# Patient Record
Sex: Female | Born: 1975 | Race: White | Hispanic: No | Marital: Married | Smoking: Never smoker
Health system: Southern US, Community
[De-identification: ages and names within clinical notes are randomized; demographics above are authoritative.]

## PROBLEM LIST (undated history)

## (undated) ENCOUNTER — Inpatient Hospital Stay (HOSPITAL_COMMUNITY): Payer: Self-pay

## (undated) DIAGNOSIS — O99119 Other diseases of the blood and blood-forming organs and certain disorders involving the immune mechanism complicating pregnancy, unspecified trimester: Secondary | ICD-10-CM

## (undated) DIAGNOSIS — Z8619 Personal history of other infectious and parasitic diseases: Secondary | ICD-10-CM

## (undated) DIAGNOSIS — G43909 Migraine, unspecified, not intractable, without status migrainosus: Secondary | ICD-10-CM

## (undated) DIAGNOSIS — D696 Thrombocytopenia, unspecified: Secondary | ICD-10-CM

## (undated) HISTORY — DX: Personal history of other infectious and parasitic diseases: Z86.19

## (undated) HISTORY — DX: Migraine, unspecified, not intractable, without status migrainosus: G43.909

## (undated) HISTORY — DX: Thrombocytopenia, unspecified: D69.6

## (undated) HISTORY — DX: Thrombocytopenia, unspecified: O99.119

## (undated) HISTORY — PX: OTHER SURGICAL HISTORY: SHX169

---
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
## 2006-11-22 ENCOUNTER — Inpatient Hospital Stay (HOSPITAL_COMMUNITY): Admission: AD | Admit: 2006-11-22 | Discharge: 2006-11-22 | Payer: Self-pay | Admitting: Obstetrics & Gynecology

## 2007-02-01 ENCOUNTER — Inpatient Hospital Stay (HOSPITAL_COMMUNITY): Admission: AD | Admit: 2007-02-01 | Discharge: 2007-02-04 | Payer: Self-pay | Admitting: Obstetrics and Gynecology

## 2007-02-02 ENCOUNTER — Encounter (INDEPENDENT_AMBULATORY_CARE_PROVIDER_SITE_OTHER): Payer: Self-pay | Admitting: Specialist

## 2007-02-06 ENCOUNTER — Encounter: Admission: RE | Admit: 2007-02-06 | Discharge: 2007-03-08 | Payer: Self-pay | Admitting: Obstetrics and Gynecology

## 2007-03-09 ENCOUNTER — Encounter: Admission: RE | Admit: 2007-03-09 | Discharge: 2007-04-07 | Payer: Self-pay | Admitting: Obstetrics and Gynecology

## 2007-04-08 ENCOUNTER — Encounter: Admission: RE | Admit: 2007-04-08 | Discharge: 2007-05-08 | Payer: Self-pay | Admitting: Obstetrics and Gynecology

## 2007-05-09 ENCOUNTER — Encounter: Admission: RE | Admit: 2007-05-09 | Discharge: 2007-06-07 | Payer: Self-pay | Admitting: Obstetrics and Gynecology

## 2007-06-08 ENCOUNTER — Encounter: Admission: RE | Admit: 2007-06-08 | Discharge: 2007-07-08 | Payer: Self-pay | Admitting: Obstetrics and Gynecology

## 2007-07-09 ENCOUNTER — Encounter: Admission: RE | Admit: 2007-07-09 | Discharge: 2007-07-27 | Payer: Self-pay | Admitting: Obstetrics and Gynecology

## 2009-07-05 ENCOUNTER — Inpatient Hospital Stay (HOSPITAL_COMMUNITY): Admission: AD | Admit: 2009-07-05 | Discharge: 2009-07-07 | Payer: Self-pay | Admitting: Obstetrics and Gynecology

## 2011-02-28 LAB — CBC
HCT: 30.9 % — ABNORMAL LOW (ref 36.0–46.0)
HCT: 37.4 % (ref 36.0–46.0)
MCHC: 35.2 g/dL (ref 30.0–36.0)
MCV: 102.1 fL — ABNORMAL HIGH (ref 78.0–100.0)
MCV: 103.1 fL — ABNORMAL HIGH (ref 78.0–100.0)
Platelets: 112 10*3/uL — ABNORMAL LOW (ref 150–400)
Platelets: 139 10*3/uL — ABNORMAL LOW (ref 150–400)
RBC: 2.79 MIL/uL — ABNORMAL LOW (ref 3.87–5.11)
RDW: 13.5 % (ref 11.5–15.5)
WBC: 10.3 10*3/uL (ref 4.0–10.5)
WBC: 8.3 10*3/uL (ref 4.0–10.5)

## 2011-02-28 LAB — RH IMMUNE GLOB WKUP(>/=20WKS)(NOT WOMEN'S HOSP)

## 2011-05-26 LAB — OB RESULTS CONSOLE GBS: GBS: NEGATIVE

## 2011-11-10 LAB — OB RESULTS CONSOLE GC/CHLAMYDIA
Chlamydia: NEGATIVE
Gonorrhea: NEGATIVE

## 2011-11-10 LAB — OB RESULTS CONSOLE RUBELLA ANTIBODY, IGM: Rubella: IMMUNE

## 2011-11-10 LAB — OB RESULTS CONSOLE RPR: RPR: NONREACTIVE

## 2012-05-10 ENCOUNTER — Encounter: Payer: Self-pay | Admitting: Vascular Surgery

## 2012-05-25 LAB — OB RESULTS CONSOLE GBS: GBS: NEGATIVE

## 2012-06-14 ENCOUNTER — Telehealth (HOSPITAL_COMMUNITY): Payer: Self-pay | Admitting: *Deleted

## 2012-06-14 ENCOUNTER — Encounter (HOSPITAL_COMMUNITY): Payer: Self-pay | Admitting: *Deleted

## 2012-06-14 NOTE — Telephone Encounter (Signed)
Preadmission screen  

## 2012-06-15 ENCOUNTER — Inpatient Hospital Stay (HOSPITAL_COMMUNITY): Payer: BC Managed Care – PPO | Admitting: Anesthesiology

## 2012-06-15 ENCOUNTER — Inpatient Hospital Stay (HOSPITAL_COMMUNITY)
Admission: RE | Admit: 2012-06-15 | Discharge: 2012-06-17 | DRG: 373 | Disposition: A | Discharge status: Home / Self Care | Payer: BC Managed Care – PPO | Source: Ambulatory Visit | Attending: Obstetrics and Gynecology | Admitting: Obstetrics and Gynecology

## 2012-06-15 ENCOUNTER — Encounter (HOSPITAL_COMMUNITY): Payer: Self-pay

## 2012-06-15 ENCOUNTER — Encounter (HOSPITAL_COMMUNITY): Payer: Self-pay | Admitting: Anesthesiology

## 2012-06-15 DIAGNOSIS — D696 Thrombocytopenia, unspecified: Secondary | ICD-10-CM | POA: Diagnosis present

## 2012-06-15 DIAGNOSIS — D689 Coagulation defect, unspecified: Principal | ICD-10-CM | POA: Diagnosis present

## 2012-06-15 DIAGNOSIS — O9912 Other diseases of the blood and blood-forming organs and certain disorders involving the immune mechanism complicating childbirth: Principal | ICD-10-CM | POA: Diagnosis present

## 2012-06-15 LAB — CBC
HCT: 34 % — ABNORMAL LOW (ref 36.0–46.0)
Hemoglobin: 11.5 g/dL — ABNORMAL LOW (ref 12.0–15.0)
MCH: 34.3 pg — ABNORMAL HIGH (ref 26.0–34.0)
MCH: 34.1 pg — ABNORMAL HIGH (ref 26.0–34.0)
MCHC: 33.8 g/dL (ref 30.0–36.0)
Platelets: 124 10*3/uL — ABNORMAL LOW (ref 150–400)
RBC: 3.47 MIL/uL — ABNORMAL LOW (ref 3.87–5.11)
RBC: 3.37 MIL/uL — ABNORMAL LOW (ref 3.87–5.11)
WBC: 6.8 10*3/uL (ref 4.0–10.5)

## 2012-06-15 LAB — RPR: RPR Ser Ql: NONREACTIVE

## 2012-06-15 MED ORDER — LACTATED RINGERS IV SOLN
INTRAVENOUS | Status: DC
  Administered 2012-06-15 (×2): 1000 mL via INTRAVENOUS

## 2012-06-15 MED ORDER — PRENATAL MULTIVITAMIN CH
1.0000 | ORAL_TABLET | Freq: Every day | ORAL | Status: DC
  Administered 2012-06-16 – 2012-06-17 (×2): 1 via ORAL
  Filled 2012-06-15 (×2): qty 1

## 2012-06-15 MED ORDER — LACTATED RINGERS IV SOLN
500.0000 mL | INTRAVENOUS | Status: DC | PRN
  Administered 2012-06-15: 1000 mL via INTRAVENOUS

## 2012-06-15 MED ORDER — OXYTOCIN BOLUS FROM INFUSION
250.0000 mL | Freq: Once | INTRAVENOUS | Status: DC
  Filled 2012-06-15: qty 500

## 2012-06-15 MED ORDER — LIDOCAINE HCL (PF) 1 % IJ SOLN
INTRAMUSCULAR | Status: DC | PRN
  Administered 2012-06-15 (×3): 4 mL

## 2012-06-15 MED ORDER — CITRIC ACID-SODIUM CITRATE 334-500 MG/5ML PO SOLN: 30.0000 mL | ORAL | Status: DC | PRN

## 2012-06-15 MED ORDER — EPHEDRINE 5 MG/ML INJ: 10.0000 mg | INTRAVENOUS | Status: DC | PRN

## 2012-06-15 MED ORDER — METHYLERGONOVINE MALEATE 0.2 MG/ML IJ SOLN: 0.2000 mg | INTRAMUSCULAR | Status: DC | PRN

## 2012-06-15 MED ORDER — PHENYLEPHRINE 40 MCG/ML (10ML) SYRINGE FOR IV PUSH (FOR BLOOD PRESSURE SUPPORT): 80.0000 ug | PREFILLED_SYRINGE | INTRAVENOUS | Status: DC | PRN

## 2012-06-15 MED ORDER — ONDANSETRON HCL 4 MG/2ML IJ SOLN: 4.0000 mg | Freq: Four times a day (QID) | INTRAMUSCULAR | Status: DC | PRN

## 2012-06-15 MED ORDER — DIPHENHYDRAMINE HCL 25 MG PO CAPS: 25.0000 mg | ORAL_CAPSULE | Freq: Four times a day (QID) | ORAL | Status: DC | PRN

## 2012-06-15 MED ORDER — LIDOCAINE HCL (PF) 1 % IJ SOLN: 30.0000 mL | INTRAMUSCULAR | Status: DC | PRN

## 2012-06-15 MED ORDER — SENNOSIDES-DOCUSATE SODIUM 8.6-50 MG PO TABS
2.0000 | ORAL_TABLET | Freq: Every day | ORAL | Status: DC
  Administered 2012-06-15 – 2012-06-16 (×2): 2 via ORAL

## 2012-06-15 MED ORDER — SIMETHICONE 80 MG PO CHEW: 80.0000 mg | CHEWABLE_TABLET | ORAL | Status: DC | PRN

## 2012-06-15 MED ORDER — METHYLERGONOVINE MALEATE 0.2 MG PO TABS: 0.2000 mg | ORAL_TABLET | ORAL | Status: DC | PRN

## 2012-06-15 MED ORDER — MEASLES, MUMPS & RUBELLA VAC ~~LOC~~ INJ: 0.5000 mL | INJECTION | Freq: Once | SUBCUTANEOUS | Status: DC

## 2012-06-15 MED ORDER — BENZOCAINE-MENTHOL 20-0.5 % EX AERO
1.0000 "application " | INHALATION_SPRAY | CUTANEOUS | Status: DC | PRN
  Administered 2012-06-15: 1 via TOPICAL
  Filled 2012-06-15: qty 56

## 2012-06-15 MED ORDER — LACTATED RINGERS IV SOLN: 500.0000 mL | Freq: Once | INTRAVENOUS | Status: DC

## 2012-06-15 MED ORDER — OXYCODONE-ACETAMINOPHEN 5-325 MG PO TABS
1.0000 | ORAL_TABLET | ORAL | Status: DC | PRN
  Administered 2012-06-15 – 2012-06-17 (×7): 1 via ORAL
  Filled 2012-06-15 (×7): qty 1

## 2012-06-15 MED ORDER — EPHEDRINE 5 MG/ML INJ
10.0000 mg | INTRAVENOUS | Status: DC | PRN
  Filled 2012-06-15: qty 4

## 2012-06-15 MED ORDER — TERBUTALINE SULFATE 1 MG/ML IJ SOLN: 0.2500 mg | Freq: Once | INTRAMUSCULAR | Status: DC | PRN

## 2012-06-15 MED ORDER — OXYTOCIN 40 UNITS IN LACTATED RINGERS INFUSION - SIMPLE MED
1.0000 m[IU]/min | INTRAVENOUS | Status: DC
Start: 2012-06-15 — End: 2012-06-15
  Administered 2012-06-15 (×2): 2 m[IU]/min via INTRAVENOUS
  Filled 2012-06-15: qty 1000

## 2012-06-15 MED ORDER — TETANUS-DIPHTH-ACELL PERTUSSIS 5-2.5-18.5 LF-MCG/0.5 IM SUSP: 0.5000 mL | Freq: Once | INTRAMUSCULAR | Status: DC

## 2012-06-15 MED ORDER — FENTANYL 2.5 MCG/ML BUPIVACAINE 1/10 % EPIDURAL INFUSION (WH - ANES)
14.0000 mL/h | INTRAMUSCULAR | Status: DC
  Administered 2012-06-15: 14 mL/h via EPIDURAL
  Filled 2012-06-15 (×3): qty 60

## 2012-06-15 MED ORDER — IBUPROFEN 600 MG PO TABS: 600.0000 mg | ORAL_TABLET | Freq: Four times a day (QID) | ORAL | Status: DC | PRN

## 2012-06-15 MED ORDER — FLEET ENEMA 7-19 GM/118ML RE ENEM: 1.0000 | ENEMA | RECTAL | Status: DC | PRN

## 2012-06-15 MED ORDER — MEDROXYPROGESTERONE ACETATE 150 MG/ML IM SUSP: 150.0000 mg | INTRAMUSCULAR | Status: DC | PRN

## 2012-06-15 MED ORDER — WITCH HAZEL-GLYCERIN EX PADS: 1.0000 "application " | MEDICATED_PAD | CUTANEOUS | Status: DC | PRN

## 2012-06-15 MED ORDER — ACETAMINOPHEN 325 MG PO TABS: 650.0000 mg | ORAL_TABLET | ORAL | Status: DC | PRN

## 2012-06-15 MED ORDER — OXYTOCIN 40 UNITS IN LACTATED RINGERS INFUSION - SIMPLE MED
62.5000 mL/h | Freq: Once | INTRAVENOUS | Status: AC
  Administered 2012-06-15: 999 mL/h via INTRAVENOUS

## 2012-06-15 MED ORDER — OXYCODONE-ACETAMINOPHEN 5-325 MG PO TABS: 1.0000 | ORAL_TABLET | ORAL | Status: DC | PRN

## 2012-06-15 MED ORDER — IBUPROFEN 600 MG PO TABS
600.0000 mg | ORAL_TABLET | Freq: Four times a day (QID) | ORAL | Status: DC
  Administered 2012-06-15 – 2012-06-17 (×7): 600 mg via ORAL
  Filled 2012-06-15 (×7): qty 1

## 2012-06-15 MED ORDER — LANOLIN HYDROUS EX OINT: TOPICAL_OINTMENT | CUTANEOUS | Status: DC | PRN

## 2012-06-15 MED ORDER — ONDANSETRON HCL 4 MG/2ML IJ SOLN: 4.0000 mg | INTRAMUSCULAR | Status: DC | PRN

## 2012-06-15 MED ORDER — ONDANSETRON HCL 4 MG PO TABS: 4.0000 mg | ORAL_TABLET | ORAL | Status: DC | PRN

## 2012-06-15 MED ORDER — FENTANYL 2.5 MCG/ML BUPIVACAINE 1/10 % EPIDURAL INFUSION (WH - ANES)
INTRAMUSCULAR | Status: DC | PRN
  Administered 2012-06-15: 14 mL/h via EPIDURAL

## 2012-06-15 MED ORDER — DIPHENHYDRAMINE HCL 50 MG/ML IJ SOLN: 12.5000 mg | INTRAMUSCULAR | Status: DC | PRN

## 2012-06-15 MED ORDER — DIBUCAINE 1 % RE OINT
1.0000 "application " | TOPICAL_OINTMENT | RECTAL | Status: DC | PRN
  Administered 2012-06-15: 1 via RECTAL
  Filled 2012-06-15: qty 28

## 2012-06-15 MED ORDER — PHENYLEPHRINE 40 MCG/ML (10ML) SYRINGE FOR IV PUSH (FOR BLOOD PRESSURE SUPPORT)
80.0000 ug | PREFILLED_SYRINGE | INTRAVENOUS | Status: DC | PRN
  Filled 2012-06-15: qty 5

## 2012-06-15 NOTE — Anesthesia Procedure Notes (Signed)

## 2012-06-15 NOTE — Progress Notes (Signed)
SVD of vigerous female infant w/ apgars of  9,9.  Placenta delivered spontaneous w/ 3VC.   2nd degree lac repaired w/ 3-0 vicryl rapide, periurethral lac repaired w/ 3-0 vicryl rapide.  Fundus firm.  EBL 450cc .  Mom and baby doing well in LDR

## 2012-06-15 NOTE — H&P (Signed)
36 yo G3P2 @ 39wks presents for IO  Pt w/ thrombocytopenia and favorable cervix presents for IOL. Last plt count in office 120  Past history - see hollister, GBS neg SVD neg  AF, VSS Gen - NAD Abd - gravid, NT CV - RRR Lungs - clear bilaterally Cvx - 4cm in office  CBC pending  A/P:  IOL, thrombocytopenia, favorable cervix

## 2012-06-15 NOTE — Anesthesia Preprocedure Evaluation (Signed)

## 2012-06-15 NOTE — Progress Notes (Signed)
Pt comfortable w/ epidural  FHT reassuring Toco Q2 Cvx c/c/+2  A/P;  Will start pushing

## 2012-06-16 LAB — CBC
HCT: 31.2 % — ABNORMAL LOW (ref 36.0–46.0)
MCH: 34.2 pg — ABNORMAL HIGH (ref 26.0–34.0)
MCV: 101.6 fL — ABNORMAL HIGH (ref 78.0–100.0)
Platelets: 124 10*3/uL — ABNORMAL LOW (ref 150–400)
RDW: 14.3 % (ref 11.5–15.5)
WBC: 10.8 10*3/uL — ABNORMAL HIGH (ref 4.0–10.5)

## 2012-06-16 MED ORDER — HYDROCORTISONE ACE-PRAMOXINE 1-1 % RE FOAM
1.0000 | Freq: Two times a day (BID) | RECTAL | Status: DC
  Administered 2012-06-16 – 2012-06-17 (×2): 1 via RECTAL
  Filled 2012-06-16 (×2): qty 10

## 2012-06-16 MED ORDER — RHO D IMMUNE GLOBULIN 1500 UNIT/2ML IJ SOLN
300.0000 ug | Freq: Once | INTRAMUSCULAR | Status: AC
  Administered 2012-06-16: 300 ug via INTRAMUSCULAR
  Filled 2012-06-16: qty 2

## 2012-06-16 NOTE — Anesthesia Postprocedure Evaluation (Signed)
  Anesthesia Post-op Note  Patient: Haley Bernard  Procedure(s) Performed: * No procedures listed *  Patient Location: Mother/Baby  Anesthesia Type: Epidural  Level of Consciousness: awake  Airway and Oxygen Therapy: Patient Spontanous Breathing  Post-op Pain: none  Post-op Assessment: Patient's Cardiovascular Status Stable, Respiratory Function Stable, Patent Airway, No signs of Nausea or vomiting, Adequate PO intake, Pain level controlled, No headache, No backache, No residual numbness and No residual motor weakness  Post-op Vital Signs: Reviewed and stable  Complications: No apparent anesthesia complications

## 2012-06-16 NOTE — Progress Notes (Signed)
Post Partum Day 1 Subjective: no complaints, up ad lib, voiding and tolerating PO  Objective: Blood pressure 107/73, pulse 68, temperature 97.9 F (36.6 C), temperature source Oral, resp. rate 18, height 5\' 3"  (1.6 m), weight 62.596 kg (138 lb), last menstrual period 09/15/2011, SpO2 100.00%, unknown if currently breastfeeding.  Physical Exam:  General: alert and cooperative Lochia: appropriate Uterine Fundus: firm Incision: perineum intact, hemorrhoids DVT Evaluation: No evidence of DVT seen on physical exam.   Basename 06/16/12 0530 06/15/12 1408  HGB 10.5* 11.5*  HCT 31.2* 34.0*    Assessment/Plan: Plan for discharge tomorrow Proctofoam HC   LOS: 1 day   Vince Ainsley G 06/16/2012, 7:49 AM

## 2012-06-17 LAB — RH IG WORKUP (INCLUDES ABO/RH)
Antibody Screen: NEGATIVE
Fetal Screen: NEGATIVE
Unit division: 0

## 2012-06-17 MED ORDER — OXYCODONE-ACETAMINOPHEN 5-325 MG PO TABS: 1.0000 | ORAL_TABLET | ORAL | Status: AC | PRN

## 2012-06-17 MED ORDER — HYDROCORTISONE ACE-PRAMOXINE 1-1 % RE FOAM: 1.0000 | Freq: Two times a day (BID) | RECTAL | Status: AC

## 2012-06-17 MED ORDER — IBUPROFEN 600 MG PO TABS: 600.0000 mg | ORAL_TABLET | Freq: Four times a day (QID) | ORAL | Status: AC

## 2012-06-17 NOTE — Discharge Summary (Signed)
Obstetric Discharge Summary Reason for Admission: induction of labor Prenatal Procedures: ultrasound Intrapartum Procedures: spontaneous vaginal delivery Postpartum Procedures: none Complications-Operative and Postpartum: 2 degree perineal laceration Hemoglobin  Date Value Range Status  06/16/2012 10.5* 12.0 - 15.0 g/dL Final     HCT  Date Value Range Status  06/16/2012 31.2* 36.0 - 46.0 % Final    Physical Exam:  General: alert and cooperative Lochia: appropriate Uterine Fundus: firm Incision: perineum intact, hemorrhoids, non- thrombosed DVT Evaluation: No evidence of DVT seen on physical exam.  Discharge Diagnoses: Term Pregnancy-delivered  Discharge Information: Date: 06/17/2012 Activity: pelvic rest Diet: routine Medications: PNV, Ibuprofen, Percocet and proctofoam HC Condition: stable Instructions: refer to practice specific booklet Discharge to: home   Newborn Data: Live born female  Birth Weight: 7 lb 10 oz (3459 g) APGAR: 9, 9  Home with mother.  Baruch Lewers G 06/17/2012, 8:16 AM

## 2012-06-21 ENCOUNTER — Inpatient Hospital Stay (HOSPITAL_COMMUNITY): Admission: AD | Admit: 2012-06-21 | Payer: Self-pay | Source: Ambulatory Visit | Admitting: Obstetrics and Gynecology

## 2014-06-13 ENCOUNTER — Ambulatory Visit
Admission: RE | Admit: 2014-06-13 | Discharge: 2014-06-13 | Disposition: A | Discharge status: Home / Self Care | Payer: BC Managed Care – PPO | Source: Ambulatory Visit | Attending: Family Medicine | Admitting: Family Medicine

## 2014-06-13 ENCOUNTER — Other Ambulatory Visit: Payer: Self-pay | Admitting: Family Medicine

## 2014-06-13 DIAGNOSIS — R599 Enlarged lymph nodes, unspecified: Secondary | ICD-10-CM

## 2014-06-26 ENCOUNTER — Ambulatory Visit (INDEPENDENT_AMBULATORY_CARE_PROVIDER_SITE_OTHER): Payer: BC Managed Care – PPO | Admitting: General Surgery

## 2014-06-26 ENCOUNTER — Encounter (INDEPENDENT_AMBULATORY_CARE_PROVIDER_SITE_OTHER): Payer: Self-pay | Admitting: General Surgery

## 2014-06-26 VITALS — BP 116/70 | HR 71 | Temp 98.4°F | Ht 63.0 in | Wt 107.0 lb

## 2014-06-26 DIAGNOSIS — R590 Localized enlarged lymph nodes: Secondary | ICD-10-CM | POA: Insufficient documentation

## 2014-06-26 DIAGNOSIS — R599 Enlarged lymph nodes, unspecified: Secondary | ICD-10-CM

## 2014-06-26 NOTE — Progress Notes (Signed)
Patient ID: Haley Bernard, female   DOB: 02/18/1976, 38 y.o.   MRN: [id number]  Chief Complaint  Patient presents with  . Lymphadenopathy    HPI Haley Bernard is a 38 y.o. female.  We are asked to see the patient in consultation by Dr. Yehuda MaoWillard to evaluate her for an enlarged lymph nodes in the neck region. The patient is a 38 year old white female who first noticed a lump on her left neck about one year ago. It seemed to go away. A couple months ago she noticed again. She denies any pain associated with the lymph nodes. She has not had any fatigue that she notes. She denies any unintentional weight loss.  HPI  Past Medical History  Diagnosis Date  . H/O varicella     Past Surgical History  Procedure Laterality Date  . Wisdom tooth extraction      Family History  Problem Relation Age of Onset  . Cancer Mother     melanoma  . Heart attack Father   . Heart disease Father   . Hypertension Father   . Cancer Maternal Grandmother     breast  . Heart disease Paternal Grandfather   . Cancer Paternal Grandmother     lung    Social History History  Substance Use Topics  . Smoking status: Never Smoker   . Smokeless tobacco: Never Used  . Alcohol Use: No    No Known Allergies  Current Outpatient Prescriptions  Medication Sig Dispense Refill  . cyanocobalamin 100 MCG tablet Take 100 mcg by mouth daily.       No current facility-administered medications for this visit.    Review of Systems Review of Systems  Constitutional: Negative.   HENT: Negative.   Eyes: Negative.   Respiratory: Negative.   Cardiovascular: Negative.   Gastrointestinal: Negative.   Endocrine: Negative.   Genitourinary: Negative.   Musculoskeletal: Negative.   Skin: Negative.   Allergic/Immunologic: Negative.   Neurological: Negative.   Hematological: Negative.   Psychiatric/Behavioral: Negative.     Blood pressure 116/70, pulse 71, temperature 98.4 F (36.9 C), height 5\' 3"  (1.6 m),  weight 107 lb (48.535 kg), unknown if currently breastfeeding.  Physical Exam Physical Exam  Constitutional: She is oriented to person, place, and time. She appears well-developed and well-nourished.  HENT:  Head: Normocephalic and atraumatic.  Eyes: Conjunctivae and EOM are normal. Pupils are equal, round, and reactive to light.  Neck: Normal range of motion. Neck supple.  There is one small round mobile palpable lymph node on each side of the neck.  Cardiovascular: Normal rate, regular rhythm and normal heart sounds.   Pulmonary/Chest: Effort normal and breath sounds normal.  Abdominal: Soft. Bowel sounds are normal.  Musculoskeletal: Normal range of motion.  Neurological: She is alert and oriented to person, place, and time.  Skin: Skin is warm and dry.  Psychiatric: She has a normal mood and affect. Her behavior is normal.    Data Reviewed As above  Assessment    The patient has a couple of small palpable lymph nodes in her neck region. She is otherwise asymptomatic. I suspect that these are just small reactive lymph nodes but never went back down. I do not think these need to be biopsied unless something changes.     Plan    At this point she will continue to monitor these areas. She did become larger or painful then she will let us know. Otherwise we will plan to see her back  on a when necessary basis        TOTH III,PAUL S 06/26/2014, 10:42 AM

## 2014-06-26 NOTE — Patient Instructions (Signed)
Call if nodes enlarge or become painful or if other symptoms develop

## 2014-07-23 ENCOUNTER — Telehealth: Payer: Self-pay | Admitting: Hematology

## 2014-07-23 NOTE — Telephone Encounter (Signed)
S/W PATIENT AND GAVE NP APPT FOR 09/01 @ 1:30 W/DR. SEHBAI REFERRING DR. Geralynn Ochs, PA DX- THROMBOCYTOPENIA

## 2014-07-24 ENCOUNTER — Ambulatory Visit (HOSPITAL_BASED_OUTPATIENT_CLINIC_OR_DEPARTMENT_OTHER): Payer: BC Managed Care – PPO

## 2014-07-24 ENCOUNTER — Ambulatory Visit: Payer: BC Managed Care – PPO

## 2014-07-24 ENCOUNTER — Encounter: Payer: Self-pay | Admitting: Hematology

## 2014-07-24 ENCOUNTER — Telehealth: Payer: Self-pay | Admitting: Hematology

## 2014-07-24 ENCOUNTER — Other Ambulatory Visit: Payer: Self-pay

## 2014-07-24 ENCOUNTER — Ambulatory Visit (HOSPITAL_BASED_OUTPATIENT_CLINIC_OR_DEPARTMENT_OTHER): Payer: BC Managed Care – PPO | Admitting: Hematology

## 2014-07-24 VITALS — BP 114/65 | HR 67 | Temp 97.5°F | Resp 18 | Ht 63.0 in | Wt 107.9 lb

## 2014-07-24 DIAGNOSIS — R590 Localized enlarged lymph nodes: Secondary | ICD-10-CM

## 2014-07-24 DIAGNOSIS — R718 Other abnormality of red blood cells: Secondary | ICD-10-CM | POA: Insufficient documentation

## 2014-07-24 DIAGNOSIS — R599 Enlarged lymph nodes, unspecified: Secondary | ICD-10-CM

## 2014-07-24 DIAGNOSIS — D72819 Decreased white blood cell count, unspecified: Secondary | ICD-10-CM

## 2014-07-24 DIAGNOSIS — D696 Thrombocytopenia, unspecified: Secondary | ICD-10-CM

## 2014-07-24 LAB — CBC WITH DIFFERENTIAL/PLATELET
BASO%: 0.7 % (ref 0.0–2.0)
Basophils Absolute: 0 10*3/uL (ref 0.0–0.1)
EOS%: 4.5 % (ref 0.0–7.0)
Eosinophils Absolute: 0.2 10*3/uL (ref 0.0–0.5)
HEMATOCRIT: 41.4 % (ref 34.8–46.6)
HGB: 14.2 g/dL (ref 11.6–15.9)
LYMPH%: 38.3 % (ref 14.0–49.7)
MCH: 33.3 pg (ref 25.1–34.0)
MCHC: 34.3 g/dL (ref 31.5–36.0)
MCV: 97.2 fL (ref 79.5–101.0)
MONO#: 0.3 10*3/uL (ref 0.1–0.9)
MONO%: 7 % (ref 0.0–14.0)
NEUT%: 49.5 % (ref 38.4–76.8)
NEUTROS ABS: 2.1 10*3/uL (ref 1.5–6.5)
Platelets: 151 10*3/uL (ref 145–400)
RBC: 4.26 10*6/uL (ref 3.70–5.45)
RDW: 12.6 % (ref 11.2–14.5)
WBC: 4.3 10*3/uL (ref 3.9–10.3)
lymph#: 1.6 10*3/uL (ref 0.9–3.3)
nRBC: 0 % (ref 0–0)

## 2014-07-24 NOTE — Telephone Encounter (Signed)
C/D 07/24/14 for appt. 07/24/14

## 2014-07-24 NOTE — Patient Instructions (Signed)
Patient told that if there is any problems in her blood counts or lymph nodes get bigger to call us.

## 2014-07-24 NOTE — Progress Notes (Signed)
[facility] NOTE  Patient Care Team: Carlos Levering, PA-C as PCP - General (Family Medicine) Carlos Levering, PA-C as Referring Physician (Family Medicine) Allexis Bordenave Marla Roe, MD as Consulting Physician (Hematology) Surgeon Luella Cook III, MD  CHIEF COMPLAINTS/PURPOSE OF CONSULTATION:   1. Leukopenia. 2. Increased MCV. 3. Thrombocytopenia. 4. Lymph node felt in neck.  HISTORY OF PRESENTING ILLNESS:   Haley Bernard 38 y.o. female from [place] who is referred here for the above mentioned issues. She first noted a lump or a lymph node in her neck about a year ago in the left posterior cervical chain. It was small, smooth, mobile, no hardness or associated skin changes. Then she did not pay attention to it for several months and one day laying on her sofa she felt the knot/lymph node again. Since her mother has advanced melanoma which metastasized to neck lymph nodes, it raised a red flag for her. There was no pain associated with it. She herself has no personal history or melanoma or any skin cancer or any malignancy. She does see dermatologist annually.   On 06/12/14 she brought this to attention of Carlos Levering PA-C at [facility]. A CBC was done which showed WBC 5800, Hemoglobin 13.7, hct 41, platelet count 130,000, MCV 100.9. B 12 level on 06/20/2014 was 367 pg. Folate was 14.2 ng both normal. Patient have started using a B 12 capsule daily. A 2 view chest xray 06/13/2014 showed no active cardiopulmonary process. Repeat CBC on 07/17/2014 showed a WBC 3600, hemoglobin 13.5 gm, hct 40, platelet count 142,000, MCV 100.6.  Patient did not have any constitutional or B symptoms specifically no weight loss, fatigue, anorexia, night sweats, fever, malaise. Even though her WBC count on second check was low her ANC was 5410 and 4210 normal range both times. She was seen by Dr Luella Cook III in surgery who also examined her and recommended surveillance  and did not offer surgery because of overall benign presentation. She is being referred here to discuss the abnormalities in her labs and to see if there may be any association with her lymph nodes.   MEDICAL HISTORY:  Past Medical History  Diagnosis Date  . H/O varicella   . Thrombocytopenia complicating pregnancy 05/963 and 05/2012    with her 2nd and 3rd pregnancy    SURGICAL HISTORY: Past Surgical History  Procedure Laterality Date  . Wisdom tooth extraction  1998    SOCIAL HISTORY: History   Social History  . Marital Status: Married    Spouse Name: N/A    Number of Children: 3  . Years of Education: college   Occupational History  .      Homemaker   Social History Main Topics  . Smoking status: Never Smoker   . Smokeless tobacco: Never Used  . Alcohol Use: No  . Drug Use: No  . Sexual Activity: Yes   Other Topics Concern  . Not on file   Social History Narrative   Patient is a housewife. She has 3 boys ages 20, 74, 36 years old, she is a Forensic psychologist, married and very health conscious and exercises and does aerobic activities.    FAMILY HISTORY: Family History  Problem Relation Age of Onset  . Cancer Mother     melanoma  . Heart attack Father   . Heart disease Father   . Hypertension Father   . Cancer Maternal Grandmother     breast  . Heart disease  Paternal Grandfather   . Cancer Paternal Grandmother     lung    ALLERGIES:  has No Known Allergies.  MEDICATIONS:  Current Outpatient Prescriptions  Medication Sig Dispense Refill  . cyanocobalamin 100 MCG tablet Take 100 mcg by mouth daily.       No current facility-administered medications for this visit.    REVIEW OF SYSTEMS:   Constitutional: Denies fevers, chills or abnormal night sweats Eyes: Denies blurriness of vision, double vision or watery eyes Ears, nose, mouth, throat, and face: Denies mucositis or sore throat Respiratory: Denies cough, dyspnea or wheezes Cardiovascular: Denies  palpitation, chest discomfort or lower extremity swelling Gastrointestinal:  Denies nausea, heartburn or change in bowel habits Skin: Denies abnormal skin rashes Lymphatics: Denies new lymphadenopathy or easy bruising Neurological:Denies numbness, tingling or new weaknesses Behavioral/Psych: Mood is stable, no new changes  All other systems were reviewed with the patient and are negative.  PHYSICAL EXAMINATION: ECOG PERFORMANCE STATUS: 0 KPS 100  Filed Vitals:   07/24/14 1406  BP: 114/65  Pulse: 67  Temp: 97.5 F (36.4 C)  Resp: 18   Filed Weights   07/24/14 1406  Weight: 107 lb 14.4 oz (48.943 kg)    GENERAL:alert, no distress and comfortable SKIN: skin color, texture, turgor are normal, no rashes or significant lesions EYES: normal, conjunctiva are pink and non-injected, sclera clear OROPHARYNX:no exudate, no erythema and lips, buccal mucosa, and tongue normal  NECK: supple, thyroid normal size, non-tender, without nodularity LYMPH:  Small subcentimter LN in left posterior cervical chain. I could not feel the Occipital LN. LUNGS: clear to auscultation and percussion with normal breathing effort HEART: regular rate & rhythm and no murmurs and no lower extremity edema ABDOMEN:abdomen soft, non-tender and normal bowel sounds, no hepatosplenomegaly noted. Musculoskeletal:no cyanosis of digits and no clubbing  PSYCH: alert & oriented x 3 with fluent speech NEURO: no focal motor/sensory deficits  LABORATORY DATA:   We did a CBC in my office today     RADIOGRAPHIC STUDIES:  I have personally reviewed the radiological images as listed and agreed with the findings in the report.    CXR 06/13/2014 CLINICAL DATA: Left cervical lymphadenopathy  EXAM: CHEST 2 VIEW COMPARISON: None.  FINDINGS: Cardiomediastinal silhouette is unremarkable. Mild lower thoracic dextroscoliosis. No acute infiltrate or pleural effusion. No pulmonary edema. Bony thorax is unremarkable. IMPRESSION:  No active cardiopulmonary disease  Mammogram was fine.    ASSESSMENT & PLAN: 38 years old female with following hematological problems.  1. Cervical adenopathy: Likely benign lymph node we can feel (reactive). Her clinical picture does not fit a malignancy like Lymphoma or melanoma or breast cancer. She has no constitutional symptoms whatsoever. The node on exam feels benign. She does not have multiple lymph nodes or conglomerate of lymph nodes. I recommended doing another follow up and clinical neck exam in 4 months and if it is stable, continue surveillance. If I feel it is getting bigger or any other lymph nodes show up, we can consider an imaging study like an ultrasound or CT scan to visualize the lymph nodes and perhaps biopsy one if they are large enough.  2. 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 It also showed up in her last 2 pregnancies but was not severe enough to warrant any systemic therapy. Platelets for most part stayed above 100,000 range. Her last 3 readings here were 130,000 on 06/12/2014, 142,000 on 07/17/2014 and today here  at cancer center her reading was 151,000 which is in normal range. She does not have any signs and symptoms of mucocutaneous bleeding to suggest dysfunctional platelets. She is not on any medication to explain low platelets. That range of 130,000 to 150,000 is likely her baseline platelet count range which is adequate for homeostasis and for any surgery. If she becomes pregnant again, she can potentially have a more severe form of thrombocytopenia. There is some immune association for both conditions that is ITP and pregnancy induced thrombocytopenia. Smear shows some large platelets and high MPV which is fine and can be a feature of Macrothrombocytopenia-syndromes like May-Hegglin anomaly, Felix Ahmadi platelet syndrome, grey platelet syndrome, Fechtner Syndrome, Epstein syndrome etc. Usually  the degree of thrombocytopenia in those conditions is more moderate to severe.  3. High MCV: Macrocytosis can be seen in patients who have thyroid problem,. Alcoholics, MDS, T01/SWFUXN deficiency and from certain medications. A TSH has been ordered which is adequate. Our MCV today was 97 normal.even during her pregnancies, when she had mild thrombocytopenia, the MCV was mildly elevated. I do not think it is pathological. A Reticulocyte count has been ordered by PCP and a SPEP both are adequate tests. Macrocytosis itself is not harmful but it is important to find out the cause esp if it is severe. She was told to take a vitamin b12 daily which i think is reasonable has her level was somewhat low normal. Cut back on alcohol use.  4. Leukopenia but no neutropenia: of the last 3 readings on CBC recently 2 of them showed normal WBC and normal ANC. There was no left shift. No immature or dysplastic cells seen on smear. I will just recommend Observation and surveillance. I do not see any reason or indication right now to do a bone marrow biopsy and aspirate. Historically her white count has been normal even during pregnancies.  5. Follow up: I will see her back in 4 months and check a CBC and peripheral smear at that time.We will also re-evaluate her neck lymph nodes and other regional lymph node stations. My overall clinical impression is that we have a benign lymph node and mild variations in her blood counts which are of no clinical significance and may resolve spontaneously as we continue to watch her blood counts.  All questions were answered. The patient knows to call the clinic with any problems, questions or concerns.  I spent 60 minutes counseling the patient face to face. The total time spent in the appointment was 1 hour.    Bernadene Bell, MD Medical Hematologist/Oncologist [place] Pager: [phone] Office No: [phone]

## 2014-07-25 ENCOUNTER — Telehealth: Payer: Self-pay | Admitting: Hematology

## 2014-07-25 NOTE — Telephone Encounter (Signed)
lvm for pt regarding to Jan 2016 appt....mailed pt appt sched/avs and letter °

## 2014-08-07 ENCOUNTER — Other Ambulatory Visit: Payer: Self-pay | Admitting: Obstetrics and Gynecology

## 2014-08-08 LAB — CYTOLOGY - PAP

## 2014-09-24 ENCOUNTER — Encounter: Payer: Self-pay | Admitting: Hematology

## 2014-11-07 ENCOUNTER — Telehealth: Payer: Self-pay | Admitting: Hematology

## 2014-11-14 ENCOUNTER — Encounter (HOSPITAL_COMMUNITY): Payer: Self-pay

## 2014-11-14 ENCOUNTER — Inpatient Hospital Stay (HOSPITAL_COMMUNITY)
Admission: AD | Admit: 2014-11-14 | Discharge: 2014-11-14 | Disposition: A | Discharge status: Home / Self Care | Payer: BC Managed Care – PPO | Source: Ambulatory Visit | Attending: Obstetrics and Gynecology | Admitting: Obstetrics and Gynecology

## 2014-11-14 DIAGNOSIS — E860 Dehydration: Secondary | ICD-10-CM | POA: Diagnosis not present

## 2014-11-14 DIAGNOSIS — Z3A09 9 weeks gestation of pregnancy: Secondary | ICD-10-CM | POA: Insufficient documentation

## 2014-11-14 DIAGNOSIS — O26891 Other specified pregnancy related conditions, first trimester: Secondary | ICD-10-CM | POA: Insufficient documentation

## 2014-11-14 DIAGNOSIS — O219 Vomiting of pregnancy, unspecified: Secondary | ICD-10-CM

## 2014-11-14 DIAGNOSIS — O211 Hyperemesis gravidarum with metabolic disturbance: Secondary | ICD-10-CM | POA: Diagnosis present

## 2014-11-14 LAB — URINALYSIS, ROUTINE W REFLEX MICROSCOPIC
BILIRUBIN URINE: NEGATIVE
GLUCOSE, UA: NEGATIVE mg/dL
Hgb urine dipstick: NEGATIVE
Ketones, ur: NEGATIVE mg/dL
Nitrite: NEGATIVE
Protein, ur: NEGATIVE mg/dL
Specific Gravity, Urine: 1.02 (ref 1.005–1.030)
UROBILINOGEN UA: 0.2 mg/dL (ref 0.0–1.0)
pH: 6 (ref 5.0–8.0)

## 2014-11-14 LAB — URINE MICROSCOPIC-ADD ON

## 2014-11-14 MED ORDER — PROMETHAZINE HCL 25 MG PO TABS: 25.0000 mg | ORAL_TABLET | Freq: Four times a day (QID) | ORAL | Status: DC | PRN

## 2014-11-14 MED ORDER — METOCLOPRAMIDE HCL 10 MG PO TABS: 10.0000 mg | ORAL_TABLET | Freq: Four times a day (QID) | ORAL | Status: DC

## 2014-11-14 MED ORDER — METOCLOPRAMIDE HCL 5 MG/ML IJ SOLN
10.0000 mg | Freq: Once | INTRAMUSCULAR | Status: AC
  Administered 2014-11-14: 10 mg via INTRAVENOUS
  Filled 2014-11-14: qty 2

## 2014-11-14 MED ORDER — LACTATED RINGERS IV SOLN
Freq: Once | INTRAVENOUS | Status: AC
  Administered 2014-11-14: 16:00:00 via INTRAVENOUS
  Filled 2014-11-14: qty 1000

## 2014-11-14 NOTE — MAU Note (Signed)
Pt eating crackers at bedside.

## 2014-11-14 NOTE — MAU Provider Note (Signed)
History     CSN: [id number]  Arrival date and time: 11/14/14 1242   First Provider Initiated Contact with Patient 11/14/14 1434      Chief Complaint  Patient presents with  . Emesis During Pregnancy   HPI Comments: Haley Bernard 38 y.o. [id number] presents to MAU for fluids. She was told to come to MAU for fluids if she was feeling weak or dehydrated by OBGYN. She has vomited off and on for last 3 weeks. She has Diclegis at home which is not working and zofran which is very constipating.     Past Medical History  Diagnosis Date  . H/O varicella   . Thrombocytopenia complicating pregnancy 06/2009 and 05/2012    with her 2nd and 3rd pregnancy    Past Surgical History  Procedure Laterality Date  . Wisdom tooth extraction  1998    Family History  Problem Relation Age of Onset  . Cancer Mother     melanoma  . Heart attack Father   . Heart disease Father   . Hypertension Father   . Cancer Maternal Grandmother     breast  . Heart disease Paternal Grandfather   . Cancer Paternal Grandmother     lung    History  Substance Use Topics  . Smoking status: Never Smoker   . Smokeless tobacco: Never Used  . Alcohol Use: No    Allergies: No Known Allergies  Prescriptions prior to admission  Medication Sig Dispense Refill Last Dose  . Doxylamine-Pyridoxine (DICLEGIS) 10-10 MG TBEC Take 1-2 tablets by mouth 2 (two) times daily. Take 1 tablet in the morning and then take 2 tablets at bedtime.   11/14/2014 at Unknown time  . Prenatal Vit-Fe Fumarate-FA (PRENATAL MULTIVITAMIN) TABS tablet Take 1 tablet by mouth daily at 12 noon.   11/13/2014 at Unknown time    Review of Systems  Constitutional: Negative.   HENT: Negative.   Eyes: Negative.   Respiratory: Negative.   Cardiovascular: Negative.   Gastrointestinal: Positive for nausea and vomiting.  Genitourinary: Negative.   Musculoskeletal: Negative.   Skin: Negative.   Neurological: Negative.    Psychiatric/Behavioral: Negative.    Physical Exam   Blood pressure 104/64, pulse 90, temperature 99.1 F (37.3 C), temperature source Oral, resp. rate 16, height 5' 4.5" (1.638 m), weight 49.351 kg (108 lb 12.8 oz), SpO2 100 %, unknown if currently breastfeeding.  Physical Exam  Constitutional: She is oriented to person, place, and time. She appears well-developed and well-nourished. No distress.  Eating crackers  HENT:  Head: Normocephalic and atraumatic.  Eyes: Pupils are equal, round, and reactive to light.  Cardiovascular: Normal rate, regular rhythm and normal heart sounds.   Respiratory: Effort normal and breath sounds normal. No respiratory distress. She has no wheezes.  Musculoskeletal: Normal range of motion.  Neurological: She is alert and oriented to person, place, and time.  Skin: Skin is warm and dry.  Psychiatric: She has a normal mood and affect. Her behavior is normal. Thought content normal.   Results for orders placed or performed during the hospital encounter of 11/14/14 (from the past 24 hour(s))  Urinalysis, Routine w reflex microscopic     Status: Abnormal   Collection Time: 11/14/14 12:53 PM  Result Value Ref Range   Color, Urine YELLOW YELLOW   APPearance CLEAR CLEAR   Specific Gravity, Urine 1.020 1.005 - 1.030   pH 6.0 5.0 - 8.0   Glucose, UA NEGATIVE NEGATIVE mg/dL   Hgb urine dipstick  NEGATIVE NEGATIVE   Bilirubin Urine NEGATIVE NEGATIVE   Ketones, ur NEGATIVE NEGATIVE mg/dL   Protein, ur NEGATIVE NEGATIVE mg/dL   Urobilinogen, UA 0.2 0.0 - 1.0 mg/dL   Nitrite NEGATIVE NEGATIVE   Leukocytes, UA SMALL (A) NEGATIVE  Urine microscopic-add on     Status: None   Collection Time: 11/14/14 12:53 PM  Result Value Ref Range   Squamous Epithelial / LPF RARE RARE   WBC, UA 0-2 <3 WBC/hpf   Bacteria, UA RARE RARE     MAU Course  Procedures  MDM  Spoke with Dr Renaldo FiddlerAdkins and will give one liter fluids Reglan 10 mg IVPB  Assessment and Plan   A:  Nausea and vomiting in pregnancy  P: IVF one liter Reglan 10 mg po q 8 hours Alternate with phenergan 25 mg po  At bedtime Small frequent meals Follow up with Dr Wallis MartAdkins  Stephania Macfarlane, Rubbie BattiestLinda Miller 11/14/2014, 2:46 PM

## 2014-11-14 NOTE — MAU Note (Signed)
Patient states she has had vomiting for about 3 weeks. Able to keep very little down. Feels weak. Denies bleeding, pain or discharge.

## 2014-11-14 NOTE — Discharge Instructions (Signed)

## 2014-11-23 NOTE — L&D Delivery Note (Signed)
Delivery Note  SVD viable female Apgars 9,9 over 2nd deg MLE.  Placenta delivered spontaneously intact with 3VC. Repair with 2-0 Chromic with good support and hemostasis noted and R/V exam confirms.  PH art was sent.  [place] cord blood was not done.  Mother and baby were doing well.  EBL 150cc  Candice Campavid Margarethe Virgen, MD

## 2014-11-27 ENCOUNTER — Ambulatory Visit: Payer: BC Managed Care – PPO

## 2014-11-27 ENCOUNTER — Other Ambulatory Visit: Payer: BC Managed Care – PPO

## 2014-11-27 LAB — OB RESULTS CONSOLE RUBELLA ANTIBODY, IGM: RUBELLA: IMMUNE

## 2014-11-27 LAB — OB RESULTS CONSOLE ABO/RH: RH TYPE: POSITIVE

## 2014-11-27 LAB — OB RESULTS CONSOLE GC/CHLAMYDIA
Chlamydia: NEGATIVE
GC PROBE AMP, GENITAL: NEGATIVE

## 2014-11-27 LAB — OB RESULTS CONSOLE HEPATITIS B SURFACE ANTIGEN: HEP B S AG: NEGATIVE

## 2014-11-27 LAB — OB RESULTS CONSOLE RPR: RPR: NONREACTIVE

## 2014-11-27 LAB — OB RESULTS CONSOLE HIV ANTIBODY (ROUTINE TESTING): HIV: NONREACTIVE

## 2014-11-27 LAB — OB RESULTS CONSOLE ANTIBODY SCREEN: Antibody Screen: NEGATIVE

## 2014-11-29 ENCOUNTER — Other Ambulatory Visit: Payer: Self-pay | Admitting: Obstetrics and Gynecology

## 2014-11-30 LAB — CYTOLOGY - PAP

## 2014-12-10 ENCOUNTER — Other Ambulatory Visit: Payer: BC Managed Care – PPO

## 2014-12-10 ENCOUNTER — Ambulatory Visit: Payer: BC Managed Care – PPO | Admitting: Hematology

## 2014-12-10 ENCOUNTER — Telehealth: Payer: Self-pay | Admitting: Hematology

## 2014-12-10 NOTE — Telephone Encounter (Signed)
LM returning vm to r/s appt. Appt r/s from 01/18 to 02/09.

## 2014-12-21 ENCOUNTER — Other Ambulatory Visit: Payer: Self-pay | Admitting: *Deleted

## 2014-12-21 DIAGNOSIS — R718 Other abnormality of red blood cells: Secondary | ICD-10-CM

## 2014-12-21 DIAGNOSIS — D696 Thrombocytopenia, unspecified: Secondary | ICD-10-CM

## 2014-12-21 DIAGNOSIS — D72819 Decreased white blood cell count, unspecified: Secondary | ICD-10-CM

## 2014-12-24 ENCOUNTER — Other Ambulatory Visit (HOSPITAL_BASED_OUTPATIENT_CLINIC_OR_DEPARTMENT_OTHER): Payer: BLUE CROSS/BLUE SHIELD

## 2014-12-24 ENCOUNTER — Ambulatory Visit (HOSPITAL_BASED_OUTPATIENT_CLINIC_OR_DEPARTMENT_OTHER): Payer: BLUE CROSS/BLUE SHIELD | Admitting: Hematology

## 2014-12-24 ENCOUNTER — Encounter: Payer: Self-pay | Admitting: Hematology

## 2014-12-24 VITALS — BP 105/61 | HR 89 | Temp 98.4°F | Resp 18 | Ht 64.5 in | Wt 122.3 lb

## 2014-12-24 DIAGNOSIS — R590 Localized enlarged lymph nodes: Secondary | ICD-10-CM

## 2014-12-24 DIAGNOSIS — R599 Enlarged lymph nodes, unspecified: Secondary | ICD-10-CM

## 2014-12-24 DIAGNOSIS — O99112 Other diseases of the blood and blood-forming organs and certain disorders involving the immune mechanism complicating pregnancy, second trimester: Secondary | ICD-10-CM

## 2014-12-24 DIAGNOSIS — R718 Other abnormality of red blood cells: Secondary | ICD-10-CM

## 2014-12-24 DIAGNOSIS — D7589 Other specified diseases of blood and blood-forming organs: Secondary | ICD-10-CM

## 2014-12-24 DIAGNOSIS — D72819 Decreased white blood cell count, unspecified: Secondary | ICD-10-CM

## 2014-12-24 DIAGNOSIS — O99012 Anemia complicating pregnancy, second trimester: Secondary | ICD-10-CM

## 2014-12-24 DIAGNOSIS — D696 Thrombocytopenia, unspecified: Secondary | ICD-10-CM

## 2014-12-24 DIAGNOSIS — O9912 Other diseases of the blood and blood-forming organs and certain disorders involving the immune mechanism complicating childbirth: Secondary | ICD-10-CM

## 2014-12-24 LAB — CBC WITH DIFFERENTIAL/PLATELET
BASO%: 0.2 % (ref 0.0–2.0)
Basophils Absolute: 0 10*3/uL (ref 0.0–0.1)
EOS ABS: 0.2 10*3/uL (ref 0.0–0.5)
EOS%: 2.7 % (ref 0.0–7.0)
HCT: 34.5 % — ABNORMAL LOW (ref 34.8–46.6)
HGB: 11.7 g/dL (ref 11.6–15.9)
LYMPH%: 18.4 % (ref 14.0–49.7)
MCH: 33.2 pg (ref 25.1–34.0)
MCHC: 33.9 g/dL (ref 31.5–36.0)
MCV: 98 fL (ref 79.5–101.0)
MONO#: 0.5 10*3/uL (ref 0.1–0.9)
MONO%: 8.5 % (ref 0.0–14.0)
NEUT#: 4.1 10*3/uL (ref 1.5–6.5)
NEUT%: 70.2 % (ref 38.4–76.8)
Platelets: 159 10*3/uL (ref 145–400)
RBC: 3.52 10*6/uL — AB (ref 3.70–5.45)
RDW: 13.8 % (ref 11.2–14.5)
WBC: 5.9 10*3/uL (ref 3.9–10.3)
lymph#: 1.1 10*3/uL (ref 0.9–3.3)

## 2014-12-24 LAB — CHCC SMEAR

## 2014-12-24 NOTE — Progress Notes (Signed)
[facility] HEMATOLOGY CONSULT NOTE  Patient Care Team: Carilyn Goodpasture, PA-C as PCP - General (Family Medicine) Carilyn Goodpasture, PA-C as Referring Physician (Family Medicine) Aasim Nelva Bush, MD as Consulting Physician (Hematology) Surgeon Caleen Essex III, MD  CHIEF COMPLAINTS/PURPOSE OF CONSULTATION:   1. Leukopenia. 2. Increased MCV. 3. Thrombocytopenia. 4. Lymph node felt in neck.  HISTORY OF PRESENTING ILLNESS:   Haley Bernard 39 y.o. female from [place] who is referred here for the above mentioned issues. She first noted a lump or a lymph node in her neck about a year ago in the left posterior cervical chain. It was small, smooth, mobile, no hardness or associated skin changes. Then she did not pay attention to it for several months and one day laying on her sofa she felt the knot/lymph node again. Since her mother has advanced melanoma which metastasized to neck lymph nodes, it raised a red flag for her. There was no pain associated with it. She herself has no personal history or melanoma or any skin cancer or any malignancy. She does see dermatologist annually.   On 06/12/14 she brought this to attention of Carilyn Goodpasture PA-C at [facility]. A CBC was done which showed WBC 5800, Hemoglobin 13.7, hct 41, platelet count 130,000, MCV 100.9. B 12 level on 06/20/2014 was 367 pg. Folate was 14.2 ng both normal. Patient have started using a B 12 capsule daily. A 2 view chest xray 06/13/2014 showed no active cardiopulmonary process. Repeat CBC on 07/17/2014 showed a WBC 3600, hemoglobin 13.5 gm, hct 40, platelet count 142,000, MCV 100.6.  Patient did not have any constitutional or B symptoms specifically no weight loss, fatigue, anorexia, night sweats, fever, malaise. Even though her WBC count on second check was low her ANC was 5410 and 4210 normal range both times. She was seen by Dr Caleen Essex III in surgery who also examined her and recommended surveillance  and did not offer surgery because of overall benign presentation. She is being referred here to discuss the abnormalities in her labs and to see if there may be any association with her lymph nodes.  INTERIM HISTORY She returns for follow up. She became pregnant with her 4th child in Oct 2015. She feels well overall, she still has some taste change, but morning sickness has much improved. She take prenatal multivit. She feels her neck nodes are getting smaller. No fever or chills, no night sweats.   MEDICAL HISTORY:  Past Medical History  Diagnosis Date  . H/O varicella   . Thrombocytopenia complicating pregnancy 06/2009 and 05/2012    with her 2nd and 3rd pregnancy    SURGICAL HISTORY: Past Surgical History  Procedure Laterality Date  . Wisdom tooth extraction  1998    SOCIAL HISTORY: History   Social History  . Marital Status: Married    Spouse Name: N/A    Number of Children: 3  . Years of Education: college   Occupational History  .      Homemaker   Social History Main Topics  . Smoking status: Never Smoker   . Smokeless tobacco: Never Used  . Alcohol Use: No  . Drug Use: No  . Sexual Activity: Yes   Other Topics Concern  . Not on file   Social History Narrative   Patient is a housewife. She has 3 boys ages 37, 70, 37 years old, she is a Engineer, maintenance (IT), married and very health conscious and exercises and does aerobic activities.  FAMILY HISTORY: Family History  Problem Relation Age of Onset  . Cancer Mother     melanoma  . Heart attack Father   . Heart disease Father   . Hypertension Father   . Cancer Maternal Grandmother     breast  . Heart disease Paternal Grandfather   . Cancer Paternal Grandmother     lung    ALLERGIES:  has No Known Allergies.  MEDICATIONS:  Current Outpatient Prescriptions  Medication Sig Dispense Refill  . Prenatal Vit-Fe Fumarate-FA (PRENATAL MULTIVITAMIN) TABS tablet Take 1 tablet by mouth daily at 12 noon.    .  promethazine (PHENERGAN) 25 MG tablet Take 1 tablet (25 mg total) by mouth every 6 (six) hours as needed for nausea or vomiting. 30 tablet 0  . Doxylamine-Pyridoxine (DICLEGIS) 10-10 MG TBEC Take 1-2 tablets by mouth 2 (two) times daily. Take 1 tablet in the morning and then take 2 tablets at bedtime.    . metoCLOPramide (REGLAN) 10 MG tablet Take 1 tablet (10 mg total) by mouth every 6 (six) hours. (Patient not taking: Reported on 12/24/2014) 30 tablet 0   No current facility-administered medications for this visit.    REVIEW OF SYSTEMS:   Constitutional: Denies fevers, chills or abnormal night sweats Eyes: Denies blurriness of vision, double vision or watery eyes Ears, nose, mouth, throat, and face: Denies mucositis or sore throat Respiratory: Denies cough, dyspnea or wheezes Cardiovascular: Denies palpitation, chest discomfort or lower extremity swelling Gastrointestinal:  Denies nausea, heartburn or change in bowel habits Skin: Denies abnormal skin rashes Lymphatics: Denies new lymphadenopathy or easy bruising Neurological:Denies numbness, tingling or new weaknesses Behavioral/Psych: Mood is stable, no new changes  All other systems were reviewed with the patient and are negative.  PHYSICAL EXAMINATION: ECOG PERFORMANCE STATUS: 0 KPS 100  Filed Vitals:   12/24/14 0912  BP: 105/61  Pulse: 89  Temp: 98.4 F (36.9 C)  Resp: 18   Filed Weights   12/24/14 0912  Weight: 122 lb 4.8 oz (55.475 kg)    GENERAL:alert, no distress and comfortable SKIN: skin color, texture, turgor are normal, no rashes or significant lesions EYES: normal, conjunctiva are pink and non-injected, sclera clear OROPHARYNX:no exudate, no erythema and lips, buccal mucosa, and tongue normal  NECK: supple, thyroid normal size, non-tender, without nodularity LYMPH:  No palpable lymphadenopathy at neck, [place], axilla or groin area.  LUNGS: clear to auscultation and percussion with normal breathing effort HEART:  regular rate & rhythm and no murmurs and no lower extremity edema ABDOMEN:abdomen soft, non-tender and normal bowel sounds, no hepatosplenomegaly noted. Musculoskeletal:no cyanosis of digits and no clubbing  PSYCH: alert & oriented x 3 with fluent speech NEURO: no focal motor/sensory deficits  LABORATORY DATA:   We did a CBC in my office today  CBC Latest Ref Rng 12/24/2014 07/24/2014 06/16/2012  WBC 3.9 - 10.3 10e3/uL 5.9 4.3 10.8(H)  Hemoglobin 11.6 - 15.9 g/dL 16.111.7 09.614.2 10.5(L)  Hematocrit 34.8 - 46.6 % 34.5(L) 41.4 31.2(L)  Platelets 145 - 400 10e3/uL 159 151 124(L)    No flowsheet data found.     RADIOGRAPHIC STUDIES:  I have personally reviewed the radiological images as listed and agreed with the findings in the report. No new scans    CXR 06/13/2014 CLINICAL DATA: Left cervical lymphadenopathy  EXAM: CHEST 2 VIEW COMPARISON: None.  FINDINGS: Cardiomediastinal silhouette is unremarkable. Mild lower thoracic dextroscoliosis. No acute infiltrate or pleural effusion. No pulmonary edema. Bony thorax is unremarkable. IMPRESSION: No active cardiopulmonary disease  Mammogram was fine.    ASSESSMENT & PLAN: 39 years old female with following hematological problems.  1. Cervical adenopathy: Likely benign lymph node we can feel (reactive).  -resolved on today's exam -no B symptoms, very low possibility pf lymphoma   2. 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 High MCV: Macrocytosis can be seen in patients who have thyroid problem,. Alcoholics, MDS, B12/folate deficiency and from certain medications. A TSH has been ordered which is adequate.  -resolved now   4. Mild anemia -likely related to pregnancy -follow up CBC.  Followup: I will see her only as needed in future. She will follow up with her PCP and OB, and knows to monitor CBC and  call me if she has concerns.   All questions were answered. The patient knows to call the clinic with any problems, questions or concerns.  I spent 15 minutes counseling the patient face to face. The total time spent in the appointment was 20 mins     Cay Schillings, MD Medical Hematologist/Oncologist  [facility] Pager: [phone] Office No: [phone]

## 2015-01-01 ENCOUNTER — Other Ambulatory Visit: Payer: Self-pay

## 2015-01-01 ENCOUNTER — Ambulatory Visit: Payer: Self-pay | Admitting: Hematology

## 2015-06-10 ENCOUNTER — Encounter (HOSPITAL_COMMUNITY): Payer: Self-pay | Admitting: *Deleted

## 2015-06-10 ENCOUNTER — Inpatient Hospital Stay (HOSPITAL_COMMUNITY)
Admission: AD | Admit: 2015-06-10 | Discharge: 2015-06-12 | DRG: 775 | Disposition: A | Discharge status: Home / Self Care | Payer: BLUE CROSS/BLUE SHIELD | Source: Ambulatory Visit | Attending: Obstetrics and Gynecology | Admitting: Obstetrics and Gynecology

## 2015-06-10 ENCOUNTER — Telehealth (HOSPITAL_COMMUNITY): Payer: Self-pay | Admitting: *Deleted

## 2015-06-10 DIAGNOSIS — O26893 Other specified pregnancy related conditions, third trimester: Secondary | ICD-10-CM | POA: Diagnosis present

## 2015-06-10 DIAGNOSIS — IMO0001 Reserved for inherently not codable concepts without codable children: Secondary | ICD-10-CM

## 2015-06-10 DIAGNOSIS — O9912 Other diseases of the blood and blood-forming organs and certain disorders involving the immune mechanism complicating childbirth: Secondary | ICD-10-CM | POA: Diagnosis present

## 2015-06-10 DIAGNOSIS — Z8249 Family history of ischemic heart disease and other diseases of the circulatory system: Secondary | ICD-10-CM

## 2015-06-10 DIAGNOSIS — Z3A39 39 weeks gestation of pregnancy: Secondary | ICD-10-CM | POA: Diagnosis present

## 2015-06-10 DIAGNOSIS — Z6741 Type O blood, Rh negative: Secondary | ICD-10-CM

## 2015-06-10 DIAGNOSIS — D696 Thrombocytopenia, unspecified: Secondary | ICD-10-CM | POA: Diagnosis present

## 2015-06-10 DIAGNOSIS — O09523 Supervision of elderly multigravida, third trimester: Principal | ICD-10-CM

## 2015-06-10 LAB — OB RESULTS CONSOLE GBS: STREP GROUP B AG: NEGATIVE

## 2015-06-10 NOTE — Telephone Encounter (Signed)
Preadmission screen  

## 2015-06-11 ENCOUNTER — Inpatient Hospital Stay (HOSPITAL_COMMUNITY): Payer: BLUE CROSS/BLUE SHIELD | Admitting: Anesthesiology

## 2015-06-11 ENCOUNTER — Encounter (HOSPITAL_COMMUNITY): Payer: Self-pay

## 2015-06-11 DIAGNOSIS — Z6741 Type O blood, Rh negative: Secondary | ICD-10-CM | POA: Diagnosis not present

## 2015-06-11 DIAGNOSIS — Z8249 Family history of ischemic heart disease and other diseases of the circulatory system: Secondary | ICD-10-CM | POA: Diagnosis not present

## 2015-06-11 DIAGNOSIS — O9912 Other diseases of the blood and blood-forming organs and certain disorders involving the immune mechanism complicating childbirth: Secondary | ICD-10-CM | POA: Diagnosis present

## 2015-06-11 DIAGNOSIS — IMO0001 Reserved for inherently not codable concepts without codable children: Secondary | ICD-10-CM

## 2015-06-11 DIAGNOSIS — Z3A39 39 weeks gestation of pregnancy: Secondary | ICD-10-CM | POA: Diagnosis present

## 2015-06-11 DIAGNOSIS — O09523 Supervision of elderly multigravida, third trimester: Secondary | ICD-10-CM | POA: Diagnosis not present

## 2015-06-11 DIAGNOSIS — O26893 Other specified pregnancy related conditions, third trimester: Secondary | ICD-10-CM | POA: Diagnosis present

## 2015-06-11 DIAGNOSIS — D696 Thrombocytopenia, unspecified: Secondary | ICD-10-CM | POA: Diagnosis present

## 2015-06-11 LAB — RPR: RPR Ser Ql: NONREACTIVE

## 2015-06-11 LAB — CBC
HCT: 39.9 % (ref 36.0–46.0)
HEMOGLOBIN: 13.6 g/dL (ref 12.0–15.0)
MCH: 33.6 pg (ref 26.0–34.0)
MCHC: 34.1 g/dL (ref 30.0–36.0)
MCV: 98.5 fL (ref 78.0–100.0)
PLATELETS: 136 10*3/uL — AB (ref 150–400)
RBC: 4.05 MIL/uL (ref 3.87–5.11)
RDW: 13.7 % (ref 11.5–15.5)
WBC: 8.8 10*3/uL (ref 4.0–10.5)

## 2015-06-11 MED ORDER — OXYCODONE-ACETAMINOPHEN 5-325 MG PO TABS: 2.0000 | ORAL_TABLET | ORAL | Status: DC | PRN

## 2015-06-11 MED ORDER — DIBUCAINE 1 % RE OINT
1.0000 "application " | TOPICAL_OINTMENT | RECTAL | Status: DC | PRN
  Administered 2015-06-11: 1 via RECTAL
  Filled 2015-06-11: qty 28

## 2015-06-11 MED ORDER — LACTATED RINGERS IV SOLN: INTRAVENOUS | Status: DC

## 2015-06-11 MED ORDER — LIDOCAINE HCL (PF) 1 % IJ SOLN
INTRAMUSCULAR | Status: DC | PRN
Start: 2015-06-11 — End: 2015-06-11
  Administered 2015-06-11 (×2): 4 mL via EPIDURAL

## 2015-06-11 MED ORDER — OXYCODONE-ACETAMINOPHEN 5-325 MG PO TABS: 1.0000 | ORAL_TABLET | ORAL | Status: DC | PRN

## 2015-06-11 MED ORDER — PRENATAL MULTIVITAMIN CH
1.0000 | ORAL_TABLET | Freq: Every day | ORAL | Status: DC
  Administered 2015-06-11 – 2015-06-12 (×2): 1 via ORAL
  Filled 2015-06-11 (×2): qty 1

## 2015-06-11 MED ORDER — FLEET ENEMA 7-19 GM/118ML RE ENEM: 1.0000 | ENEMA | RECTAL | Status: DC | PRN

## 2015-06-11 MED ORDER — SIMETHICONE 80 MG PO CHEW: 80.0000 mg | CHEWABLE_TABLET | ORAL | Status: DC | PRN

## 2015-06-11 MED ORDER — FENTANYL 2.5 MCG/ML BUPIVACAINE 1/10 % EPIDURAL INFUSION (WH - ANES): 14.0000 mL/h | INTRAMUSCULAR | Status: DC | PRN

## 2015-06-11 MED ORDER — LANOLIN HYDROUS EX OINT: TOPICAL_OINTMENT | CUTANEOUS | Status: DC | PRN

## 2015-06-11 MED ORDER — OXYCODONE-ACETAMINOPHEN 5-325 MG PO TABS
1.0000 | ORAL_TABLET | ORAL | Status: DC | PRN
  Administered 2015-06-11: 1 via ORAL
  Filled 2015-06-11: qty 1

## 2015-06-11 MED ORDER — ACETAMINOPHEN 325 MG PO TABS: 650.0000 mg | ORAL_TABLET | ORAL | Status: DC | PRN

## 2015-06-11 MED ORDER — OXYTOCIN BOLUS FROM INFUSION
500.0000 mL | INTRAVENOUS | Status: DC
  Administered 2015-06-11: 500 mL via INTRAVENOUS

## 2015-06-11 MED ORDER — DIPHENHYDRAMINE HCL 50 MG/ML IJ SOLN: 12.5000 mg | INTRAMUSCULAR | Status: DC | PRN

## 2015-06-11 MED ORDER — LACTATED RINGERS IV SOLN
500.0000 mL | INTRAVENOUS | Status: DC | PRN
  Administered 2015-06-11: 500 mL via INTRAVENOUS

## 2015-06-11 MED ORDER — ONDANSETRON HCL 4 MG/2ML IJ SOLN: 4.0000 mg | INTRAMUSCULAR | Status: DC | PRN

## 2015-06-11 MED ORDER — WITCH HAZEL-GLYCERIN EX PADS
1.0000 "application " | MEDICATED_PAD | CUTANEOUS | Status: DC | PRN
  Administered 2015-06-11: 1 via TOPICAL

## 2015-06-11 MED ORDER — TETANUS-DIPHTH-ACELL PERTUSSIS 5-2.5-18.5 LF-MCG/0.5 IM SUSP: 0.5000 mL | Freq: Once | INTRAMUSCULAR | Status: DC

## 2015-06-11 MED ORDER — PHENYLEPHRINE 40 MCG/ML (10ML) SYRINGE FOR IV PUSH (FOR BLOOD PRESSURE SUPPORT)
80.0000 ug | PREFILLED_SYRINGE | INTRAVENOUS | Status: DC | PRN
  Filled 2015-06-11: qty 2
  Filled 2015-06-11: qty 20

## 2015-06-11 MED ORDER — MEASLES, MUMPS & RUBELLA VAC ~~LOC~~ INJ
0.5000 mL | INJECTION | Freq: Once | SUBCUTANEOUS | Status: DC
  Filled 2015-06-11: qty 0.5

## 2015-06-11 MED ORDER — DIPHENHYDRAMINE HCL 25 MG PO CAPS: 25.0000 mg | ORAL_CAPSULE | Freq: Four times a day (QID) | ORAL | Status: DC | PRN

## 2015-06-11 MED ORDER — SENNOSIDES-DOCUSATE SODIUM 8.6-50 MG PO TABS
2.0000 | ORAL_TABLET | ORAL | Status: DC
  Administered 2015-06-11: 2 via ORAL
  Filled 2015-06-11: qty 2

## 2015-06-11 MED ORDER — LIDOCAINE HCL (PF) 1 % IJ SOLN
30.0000 mL | INTRAMUSCULAR | Status: DC | PRN
  Filled 2015-06-11: qty 30

## 2015-06-11 MED ORDER — OXYTOCIN 40 UNITS IN LACTATED RINGERS INFUSION - SIMPLE MED
62.5000 mL/h | INTRAVENOUS | Status: DC
  Filled 2015-06-11: qty 1000

## 2015-06-11 MED ORDER — ONDANSETRON HCL 4 MG/2ML IJ SOLN
4.0000 mg | Freq: Four times a day (QID) | INTRAMUSCULAR | Status: DC | PRN
  Filled 2015-06-11: qty 2

## 2015-06-11 MED ORDER — EPHEDRINE 5 MG/ML INJ
10.0000 mg | INTRAVENOUS | Status: DC | PRN
  Filled 2015-06-11: qty 2

## 2015-06-11 MED ORDER — MEDROXYPROGESTERONE ACETATE 150 MG/ML IM SUSP: 150.0000 mg | INTRAMUSCULAR | Status: DC | PRN

## 2015-06-11 MED ORDER — IBUPROFEN 600 MG PO TABS
600.0000 mg | ORAL_TABLET | Freq: Four times a day (QID) | ORAL | Status: DC
  Administered 2015-06-11 – 2015-06-12 (×6): 600 mg via ORAL
  Filled 2015-06-11 (×6): qty 1

## 2015-06-11 MED ORDER — BENZOCAINE-MENTHOL 20-0.5 % EX AERO
1.0000 "application " | INHALATION_SPRAY | CUTANEOUS | Status: DC | PRN
  Administered 2015-06-11: 1 via TOPICAL
  Filled 2015-06-11: qty 56

## 2015-06-11 MED ORDER — ONDANSETRON HCL 4 MG PO TABS: 4.0000 mg | ORAL_TABLET | ORAL | Status: DC | PRN

## 2015-06-11 MED ORDER — ZOLPIDEM TARTRATE 5 MG PO TABS: 5.0000 mg | ORAL_TABLET | Freq: Every evening | ORAL | Status: DC | PRN

## 2015-06-11 MED ORDER — PHENYLEPHRINE 40 MCG/ML (10ML) SYRINGE FOR IV PUSH (FOR BLOOD PRESSURE SUPPORT): 80.0000 ug | PREFILLED_SYRINGE | INTRAVENOUS | Status: DC | PRN

## 2015-06-11 MED ORDER — FENTANYL 2.5 MCG/ML BUPIVACAINE 1/10 % EPIDURAL INFUSION (WH - ANES)
14.0000 mL/h | INTRAMUSCULAR | Status: DC | PRN
  Administered 2015-06-11 (×2): 14 mL/h via EPIDURAL
  Filled 2015-06-11: qty 125

## 2015-06-11 MED ORDER — CITRIC ACID-SODIUM CITRATE 334-500 MG/5ML PO SOLN: 30.0000 mL | ORAL | Status: DC | PRN

## 2015-06-11 NOTE — Anesthesia Preprocedure Evaluation (Signed)
Anesthesia Evaluation  Patient identified by MRN, date of birth, ID band Patient awake    Reviewed: Allergy & Precautions, Patient's Chart, lab work & pertinent test results, Unable to perform ROS - Chart review only  Airway Mallampati: II  TM Distance: >3 FB Neck ROM: Full    Dental no notable dental hx. (+) Teeth Intact   Pulmonary neg pulmonary ROS,  breath sounds clear to auscultation  Pulmonary exam normal       Cardiovascular negative cardio ROS Normal cardiovascular examRhythm:Regular Rate:Normal     Neuro/Psych negative neurological ROS  negative psych ROS   GI/Hepatic Neg liver ROS, GERD-  ,  Endo/Other  negative endocrine ROS  Renal/GU negative Renal ROS  negative genitourinary   Musculoskeletal negative musculoskeletal ROS (+)   Abdominal   Peds  Hematology Thrombocytopenia- mild   Anesthesia Other Findings   Reproductive/Obstetrics (+) Pregnancy AMA                             Anesthesia Physical Anesthesia Plan  ASA: II  Anesthesia Plan: Epidural   Post-op Pain Management:    Induction:   Airway Management Planned: Natural Airway  Additional Equipment:   Intra-op Plan:   Post-operative Plan:   Informed Consent: I have reviewed the patients History and Physical, chart, labs and discussed the procedure including the risks, benefits and alternatives for the proposed anesthesia with the patient or authorized representative who has indicated his/her understanding and acceptance.     Plan Discussed with: Anesthesiologist  Anesthesia Plan Comments:         Anesthesia Quick Evaluation

## 2015-06-11 NOTE — Anesthesia Postprocedure Evaluation (Signed)
  Anesthesia Post-op Note  Patient: Haley Bernard  Procedure(s) Performed: * No procedures listed *  Patient Location: Mother/Baby  Anesthesia Type:Epidural  Level of Consciousness: awake, alert  and oriented  Airway and Oxygen Therapy: Patient Spontanous Breathing  Post-op Pain: none  Post-op Assessment: Post-op Vital signs reviewed and Patient's Cardiovascular Status Stable              Post-op Vital Signs: Reviewed and stable  Last Vitals:  Filed Vitals:   06/11/15 0600  BP: 101/58  Pulse: 68  Temp: 36.9 C  Resp: 18    Complications: No apparent anesthesia complications

## 2015-06-11 NOTE — MAU Note (Signed)
Pt states contractions 4-7 mins for past couple of hours. Denies LOF. States some spotting. Had membranes stripped today. Was 4cm. +FM

## 2015-06-11 NOTE — Progress Notes (Signed)
Patient is doing well. No complaints  BP 101/58 mmHg  Pulse 68  Temp(Src) 98.4 F (36.9 C) (Oral)  Resp 18  Ht 5\' 3"  (1.6 m)  Wt 63.957 kg (141 lb)  BMI 24.98 kg/m2  SpO2 100%  Breastfeeding? Unknown Abdomen is soft and non tender  Results for orders placed or performed during the hospital encounter of 06/10/15 (from the past 24 hour(s))  CBC     Status: Abnormal   Collection Time: 06/11/15  1:10 AM  Result Value Ref Range   WBC 8.8 4.0 - 10.5 K/uL   RBC 4.05 3.87 - 5.11 MIL/uL   Hemoglobin 13.6 12.0 - 15.0 g/dL   HCT 16.139.9 09.636.0 - 04.546.0 %   MCV 98.5 78.0 - 100.0 fL   MCH 33.6 26.0 - 34.0 pg   MCHC 34.1 30.0 - 36.0 g/dL   RDW 40.913.7 81.111.5 - 91.415.5 %   Platelets 136 (L) 150 - 400 K/uL  Type and screen     Status: None (Preliminary result)   Collection Time: 06/11/15  1:10 AM  Result Value Ref Range   ABO/RH(D) O NEG    Antibody Screen POS    Sample Expiration 06/14/2015    Antibody Identification NO CLINICALLY SIGNIFICANT ANTIBODY IDENTIFIED    DAT, IgG NEG    Unit Number [id number]    Blood Component Type RED CELLS,LR    Unit division 00    Status of Unit ALLOCATED    Transfusion Status OK TO TRANSFUSE    Crossmatch Result COMPATIBLE    Unit Number [id number]    Blood Component Type RED CELLS,LR    Unit division 00    Status of Unit ALLOCATED    Transfusion Status OK TO TRANSFUSE    Crossmatch Result COMPATIBLE    IMPRESSION: PPD # 0  Doing well routine care

## 2015-06-11 NOTE — Progress Notes (Signed)
RN called Dr Rana SnareLowe to ask if pt may have iv pain meds due to pt having to wait on iv start and blood work to be sent and resulted before getting epidural; Dr Rana SnareLowe refused to give orders for iv pain meds due to advanced dilitation

## 2015-06-11 NOTE — H&P (Signed)
Haley Bernard is a 39 y.o. female presenting for active labor.  Preg complicated by ama with normal 1st tri screen.  Also, 2VC with normal US fu. GBS -. History OB History    Gravida Para Term Preterm AB TAB SAB Ectopic Multiple Living   4 3 3       3      Past Medical History  Diagnosis Date  . H/O varicella   . Thrombocytopenia complicating pregnancy 06/2009 and 05/2012    with her 2nd and 3rd pregnancy  . AMA (advanced maternal age) multigravida 35+    Past Surgical History  Procedure Laterality Date  . Wisdom tooth extraction  1998   Family History: family history includes Cancer in her maternal grandmother, mother, and paternal grandmother; Heart attack in her father; Heart disease in her father and paternal grandfather; Hypertension in her father. Social History:  reports that she has never smoked. She has never used smokeless tobacco. She reports that she does not drink alcohol or use illicit drugs.   Prenatal Transfer Tool  Maternal Diabetes: No Genetic Screening: Normal Maternal Ultrasounds/Referrals: Normal Fetal Ultrasounds or other Referrals:  Other: 2VC Maternal Substance Abuse:  No Significant Maternal Medications:  None Significant Maternal Lab Results:  None Other Comments:  None  ROS  Dilation: 10 Effacement (%): 80 Station: +2 Exam by:: Susy ManorErica Johnson, RN Blood pressure 119/78, pulse 72, temperature 98 F (36.7 C), temperature source Oral, resp. rate 18, height 5\' 3"  (1.6 m), weight 141 lb (63.957 kg), SpO2 100 %, unknown if currently breastfeeding. Exam Physical Exam  Prenatal labs: ABO, Rh: --/--/O NEG (07/19 0110) Antibody: POS (07/19 0110) Rubella: Immune (01/05 0000) RPR: Nonreactive (01/05 0000)  HBsAg: Negative (01/05 0000)  HIV: Non-reactive (01/05 0000)  GBS: Negative (07/18 0000)   Assessment/Plan: IUP at term Active labor Anticipate SVD 2VC   Aizen Duval C 06/11/2015, 2:51 AM

## 2015-06-11 NOTE — Anesthesia Procedure Notes (Signed)
Epidural Patient location during procedure: OB Start time: 06/11/2015 1:40 AM  Staffing Anesthesiologist: Mal AmabileFOSTER, Demarie Hyneman Performed by: anesthesiologist   Preanesthetic Checklist Completed: patient identified, site marked, surgical consent, pre-op evaluation, timeout performed, IV checked, risks and benefits discussed and monitors and equipment checked  Epidural Patient position: sitting Prep: site prepped and draped and DuraPrep Patient monitoring: continuous pulse ox and blood pressure Approach: midline Location: L3-L4 Injection technique: LOR air  Needle:  Needle type: Tuohy  Needle gauge: 17 G Needle length: 9 cm and 9 Needle insertion depth: 4 cm Catheter type: closed end flexible Catheter size: 19 Gauge Catheter at skin depth: 9 cm Test dose: negative and Other  Assessment Events: blood not aspirated, injection not painful, no injection resistance, negative IV test and no paresthesia  Additional Notes Patient identified. Risks and benefits discussed including failed block, incomplete  Pain control, post dural puncture headache, nerve damage, paralysis, blood pressure Changes, nausea, vomiting, reactions to medications-both toxic and allergic and post Partum back pain. All questions were answered. Patient expressed understanding and wished to proceed. Sterile technique was used throughout procedure. Epidural site was Dressed with sterile barrier dressing. No paresthesias, signs of intravascular injection Or signs of intrathecal spread were encountered.  Patient was more comfortable after the epidural was dosed. Please see RN's note for documentation of vital signs and FHR which are stable.

## 2015-06-11 NOTE — Lactation Note (Signed)
This note was copied from the chart of Haley Geraldine ContrasCourtney Bargar. Lactation Consultation Note; Mother states she breastfed her first 2 children for 6 months and her 3rd child for 13 months. Mother states that her infant is feeding well. She denies having any concerns. Recommended that mother page with any concerns.  Lactation brochure given to mother with information on services.   Patient Name: Haley Geraldine ContrasCourtney Erich WUJWJ'XToday's Date: 06/11/2015 Reason for consult: Initial assessment   Maternal Data Has patient been taught Hand Expression?: Yes Does the patient have breastfeeding experience prior to this delivery?: Yes  Feeding    LATCH Score/Interventions                      Lactation Tools Discussed/Used     Consult Status Consult Status: PRN    Michel BickersKendrick, Vihaan Gloss McCoy 06/11/2015, 3:50 PM

## 2015-06-12 ENCOUNTER — Inpatient Hospital Stay (HOSPITAL_COMMUNITY): Admission: RE | Admit: 2015-06-12 | Payer: BLUE CROSS/BLUE SHIELD | Source: Ambulatory Visit

## 2015-06-12 LAB — CBC
HCT: 31.7 % — ABNORMAL LOW (ref 36.0–46.0)
HEMOGLOBIN: 10.5 g/dL — AB (ref 12.0–15.0)
MCH: 33.1 pg (ref 26.0–34.0)
MCHC: 33.1 g/dL (ref 30.0–36.0)
MCV: 100 fL (ref 78.0–100.0)
Platelets: 111 10*3/uL — ABNORMAL LOW (ref 150–400)
RBC: 3.17 MIL/uL — ABNORMAL LOW (ref 3.87–5.11)
RDW: 14 % (ref 11.5–15.5)
WBC: 6.3 10*3/uL (ref 4.0–10.5)

## 2015-06-12 MED ORDER — IBUPROFEN 600 MG PO TABS: 600.0000 mg | ORAL_TABLET | Freq: Four times a day (QID) | ORAL | Status: DC

## 2015-06-12 MED ORDER — RHO D IMMUNE GLOBULIN 1500 UNIT/2ML IJ SOSY
300.0000 ug | PREFILLED_SYRINGE | Freq: Once | INTRAMUSCULAR | Status: AC
  Administered 2015-06-12: 300 ug via INTRAMUSCULAR
  Filled 2015-06-12: qty 2

## 2015-06-12 MED ORDER — OXYCODONE-ACETAMINOPHEN 5-325 MG PO TABS: 1.0000 | ORAL_TABLET | ORAL | Status: DC | PRN

## 2015-06-12 NOTE — Discharge Summary (Signed)
Obstetric Discharge Summary Reason for Admission: onset of labor Prenatal Procedures: ultrasound Intrapartum Procedures: spontaneous vaginal delivery Postpartum Procedures: none Complications-Operative and Postpartum: 2 degree perineal laceration HEMOGLOBIN  Date Value Ref Range Status  06/12/2015 10.5* 12.0 - 15.0 g/dL Final    Comment:    REPEATED TO VERIFY DELTA CHECK NOTED    HGB  Date Value Ref Range Status  12/24/2014 11.7 11.6 - 15.9 g/dL Final   HCT  Date Value Ref Range Status  06/12/2015 31.7* 36.0 - 46.0 % Final  12/24/2014 34.5* 34.8 - 46.6 % Final    Physical Exam:  General: alert and cooperative Lochia: appropriate Uterine Fundus: firm Incision: healing well DVT Evaluation: No evidence of DVT seen on physical exam. Negative Homan's sign. No cords or calf tenderness. No significant calf/ankle edema.  Discharge Diagnoses: Term Pregnancy-delivered  Discharge Information: Date: 06/12/2015 Activity: pelvic rest Diet: routine Medications: PNV, Ibuprofen and Percocet Condition: stable Instructions: refer to practice specific booklet Discharge to: home   Newborn Data: Live born female  Birth Weight: 8 lb 2.4 oz (3697 g) APGAR: 9, 9  Home with mother.  Alias Villagran G 06/12/2015, 8:15 AM

## 2015-06-13 LAB — RH IG WORKUP (INCLUDES ABO/RH)
ABO/RH(D): O NEG
Fetal Screen: NEGATIVE
Gestational Age(Wks): 39.2
Unit division: 0

## 2015-06-15 LAB — TYPE AND SCREEN
ABO/RH(D): O NEG
Antibody Screen: POSITIVE
DAT, IgG: NEGATIVE
UNIT DIVISION: 0
UNIT DIVISION: 0

## 2015-07-24 ENCOUNTER — Other Ambulatory Visit: Payer: Self-pay | Admitting: Obstetrics and Gynecology

## 2015-07-25 LAB — CYTOLOGY - PAP

## 2017-06-08 ENCOUNTER — Other Ambulatory Visit: Payer: Self-pay

## 2017-06-08 DIAGNOSIS — I8390 Asymptomatic varicose veins of unspecified lower extremity: Secondary | ICD-10-CM

## 2017-08-02 ENCOUNTER — Encounter: Payer: Self-pay | Admitting: Vascular Surgery

## 2017-08-04 ENCOUNTER — Ambulatory Visit (HOSPITAL_COMMUNITY)
Admission: RE | Admit: 2017-08-04 | Discharge: 2017-08-04 | Disposition: A | Discharge status: Home / Self Care | Payer: BLUE CROSS/BLUE SHIELD | Source: Ambulatory Visit | Attending: Vascular Surgery | Admitting: Vascular Surgery

## 2017-08-04 ENCOUNTER — Encounter: Payer: Self-pay | Admitting: Vascular Surgery

## 2017-08-04 ENCOUNTER — Ambulatory Visit (INDEPENDENT_AMBULATORY_CARE_PROVIDER_SITE_OTHER): Payer: BLUE CROSS/BLUE SHIELD | Admitting: Vascular Surgery

## 2017-08-04 VITALS — BP 102/69 | HR 67 | Resp 18 | Ht 63.0 in | Wt 110.6 lb

## 2017-08-04 DIAGNOSIS — I8393 Asymptomatic varicose veins of bilateral lower extremities: Secondary | ICD-10-CM | POA: Insufficient documentation

## 2017-08-04 DIAGNOSIS — L97909 Non-pressure chronic ulcer of unspecified part of unspecified lower leg with unspecified severity: Secondary | ICD-10-CM | POA: Diagnosis not present

## 2017-08-04 DIAGNOSIS — R609 Edema, unspecified: Secondary | ICD-10-CM | POA: Diagnosis present

## 2017-08-04 DIAGNOSIS — I8390 Asymptomatic varicose veins of unspecified lower extremity: Secondary | ICD-10-CM | POA: Diagnosis not present

## 2017-08-04 DIAGNOSIS — I83009 Varicose veins of unspecified lower extremity with ulcer of unspecified site: Secondary | ICD-10-CM

## 2017-08-04 NOTE — Progress Notes (Signed)
Patient ID: Haley Bernard, female   DOB: 06/11/1976, 41 y.o.   MRN: [id number]  Reason for Consult: Varicose Veins (had previous vein procedure (sclero))   Referred by Carilyn GoodpastureWillard, Jennifer, PA-C  Subjective:     HPI:  Haley Bernard is a 41 y.o. 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 This was after her third child. She says gland for Bradly ChrisStroud and during her pregnancy had significant varicosities. She now has pain in her right medial thigh as well as her right lateral leg where she has significant varicosities. She also has complaints of varicosities of her posterior left thigh. She does not have a history of DVT. She has never had bleeding or thrombosis of these veins. She has intermitten for compression stockings without resolution.tly   Past Medical History:  Diagnosis Date  . AMA (advanced maternal age) multigravida 35+   . H/O varicella   . Thrombocytopenia complicating pregnancy (HCC) 06/2009 and 05/2012   with her 2nd and 3rd pregnancy   Family History  Problem Relation Age of Onset  . Cancer Mother        melanoma  . Heart attack Father   . Heart disease Father   . Hypertension Father   . Cancer Maternal Grandmother        breast  . Heart disease Paternal Grandfather   . Cancer Paternal Grandmother        lung   Past Surgical History:  Procedure Laterality Date  . WISDOM TOOTH EXTRACTION  1998    Short Social History:  Social History  Substance Use Topics  . Smoking status: Never Smoker  . Smokeless tobacco: Never Used  . Alcohol use No    No Known Allergies  Current Outpatient Prescriptions  Medication Sig Dispense Refill  . ibuprofen (ADVIL,MOTRIN) 600 MG tablet Take 1 tablet (600 mg total) by mouth every 6 (six) hours. (Patient not taking: Reported on 08/04/2017) 30 tablet 1  . oxyCODONE-acetaminophen (PERCOCET/ROXICET) 5-325 MG per tablet  Take 1 tablet by mouth every 4 (four) hours as needed (for pain scale 4-7). (Patient not taking: Reported on 08/04/2017) 30 tablet 0  . Prenatal Vit-Fe Fumarate-FA (PRENATAL MULTIVITAMIN) TABS tablet Take 1 tablet by mouth daily at 12 noon.     No current facility-administered medications for this visit.     Review of Systems  Constitutional:  Constitutional negative. HENT: HENT negative.  Eyes: Eyes negative.  Respiratory: Respiratory negative.  Cardiovascular: Cardiovascular negative.  GI: Gastrointestinal negative.  Musculoskeletal: Musculoskeletal negative.  Neurological: Neurological negative. Hematologic: Hematologic/lymphatic negative.  Psychiatric: Psychiatric negative.        Objective:  Objective   Vitals:   08/04/17 1150  BP: 102/69  Pulse: 67  Resp: 18  SpO2: 100%  Weight: 110 lb 9.6 oz (50.2 kg)  Height: 5\' 3"  (1.6 m)   Body mass index is 19.59 kg/m.  Physical Exam  Constitutional: She is oriented to person, place, and time. She appears well-developed.  HENT:  Head: Normocephalic.  Eyes: Pupils are equal, round, and reactive to light.  Neck: Normal range of motion.  Cardiovascular: Normal rate.   Pulses:      Radial pulses are 2+ on the right side, and 2+ on the left side.       Dorsalis pedis pulses are 2+ on the right side, and 2+ on the left side.  Posterior tibial pulses are 2+ on the right side, and 2+ on the left side.  Pulmonary/Chest: Effort normal.  Musculoskeletal: Normal range of motion. She exhibits no edema.  Varicosities noted on right medial thigh, lateral leg Left posterior thigh  Neurological: She is alert and oriented to person, place, and time.  Skin: Skin is warm and dry.  Psychiatric: Her behavior is normal. Thought content normal.    Data: I have independently reviewed her lower extremity venous duplex which demonstrates deep venous reflux in the right and left common femoral vein but no saphenous vein reflux.       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 She is a candidate for possible sclerotherapy of her varicosities on her right medial thigh lateral leg and left posterior thigh. We will contact her to schedule this. All of her questions were answered.      Maeola Harman MD [facility]

## 2017-09-01 ENCOUNTER — Encounter: Payer: Self-pay | Admitting: *Deleted

## 2017-09-01 ENCOUNTER — Ambulatory Visit (INDEPENDENT_AMBULATORY_CARE_PROVIDER_SITE_OTHER): Payer: Self-pay | Admitting: *Deleted

## 2017-09-01 DIAGNOSIS — I781 Nevus, non-neoplastic: Secondary | ICD-10-CM

## 2017-09-01 NOTE — Progress Notes (Signed)
X=.3% Sotradecol administered with a 27g butterfly.  Patient received a total of 12cc.  Treated all areas of concern _ reticulars, spiders and red caps with 2 syringes. Easy access. Tol well. Anticipate good results. Follow prn.    Compression stockings applied: Yes.

## 2018-11-23 HISTORY — PX: BREAST BIOPSY: SHX20

## 2018-12-02 ENCOUNTER — Other Ambulatory Visit: Payer: Self-pay | Admitting: Obstetrics and Gynecology

## 2018-12-02 DIAGNOSIS — Z803 Family history of malignant neoplasm of breast: Secondary | ICD-10-CM

## 2018-12-29 ENCOUNTER — Other Ambulatory Visit: Payer: BLUE CROSS/BLUE SHIELD

## 2018-12-29 ENCOUNTER — Ambulatory Visit
Admission: RE | Admit: 2018-12-29 | Discharge: 2018-12-29 | Disposition: A | Discharge status: Home / Self Care | Payer: No Typology Code available for payment source | Source: Ambulatory Visit | Attending: Obstetrics and Gynecology | Admitting: Obstetrics and Gynecology

## 2018-12-29 DIAGNOSIS — Z803 Family history of malignant neoplasm of breast: Secondary | ICD-10-CM

## 2018-12-29 MED ORDER — GADOBUTROL 1 MMOL/ML IV SOLN
6.0000 mL | Freq: Once | INTRAVENOUS | Status: AC | PRN
  Administered 2018-12-29: 6 mL via INTRAVENOUS

## 2019-01-02 ENCOUNTER — Telehealth: Payer: Self-pay | Admitting: Family Medicine

## 2019-01-02 ENCOUNTER — Other Ambulatory Visit: Payer: Self-pay | Admitting: Obstetrics and Gynecology

## 2019-01-02 DIAGNOSIS — R9389 Abnormal findings on diagnostic imaging of other specified body structures: Secondary | ICD-10-CM

## 2019-01-02 NOTE — Telephone Encounter (Signed)
Patient returning call to Joellen. Please advise.  

## 2019-01-02 NOTE — Telephone Encounter (Signed)
Please advise if pt can be worked in sooner.   Copied from CRM [id number]. Topic: Appointment Scheduling - Scheduling Inquiry for Clinic >> Jan 02, 2019  1:16 PM Haley Bernard wrote: Reason for CRM: Pt's neighbor, Dr. Cam Bernard, recommended Dr. Earlene Plater. Pt was seen by her GYN Dr. Renaldo Fiddler and had a MRI on breasts and it detected a cyst on her liver. (see MRI results as they were at [facility]). Pt is requesting urgent new pt appt with Dr. Earlene Plater as first available is 05/01/2019. Please call to advise.

## 2019-01-02 NOTE — Telephone Encounter (Signed)
See note

## 2019-01-02 NOTE — Telephone Encounter (Signed)
Left message to return call to our office.  

## 2019-01-03 ENCOUNTER — Ambulatory Visit
Admission: RE | Admit: 2019-01-03 | Discharge: 2019-01-03 | Disposition: A | Discharge status: Home / Self Care | Payer: No Typology Code available for payment source | Source: Ambulatory Visit | Attending: Obstetrics and Gynecology | Admitting: Obstetrics and Gynecology

## 2019-01-03 ENCOUNTER — Other Ambulatory Visit: Payer: Self-pay | Admitting: Obstetrics and Gynecology

## 2019-01-03 DIAGNOSIS — R9389 Abnormal findings on diagnostic imaging of other specified body structures: Secondary | ICD-10-CM

## 2019-01-04 NOTE — Telephone Encounter (Signed)
Okay to get in (looks like next week). Okay to use acute or unavailable slot.

## 2019-01-05 ENCOUNTER — Other Ambulatory Visit: Payer: Self-pay

## 2019-01-11 ENCOUNTER — Encounter: Payer: Self-pay | Admitting: Family Medicine

## 2019-01-11 ENCOUNTER — Ambulatory Visit: Payer: Self-pay | Admitting: Family Medicine

## 2019-01-11 VITALS — BP 96/64 | HR 61 | Ht 63.0 in | Wt 111.0 lb

## 2019-01-11 DIAGNOSIS — Z13220 Encounter for screening for lipoid disorders: Secondary | ICD-10-CM

## 2019-01-11 DIAGNOSIS — K7689 Other specified diseases of liver: Secondary | ICD-10-CM

## 2019-01-11 DIAGNOSIS — D696 Thrombocytopenia, unspecified: Secondary | ICD-10-CM

## 2019-01-11 DIAGNOSIS — Z0000 Encounter for general adult medical examination without abnormal findings: Secondary | ICD-10-CM

## 2019-01-11 DIAGNOSIS — Z803 Family history of malignant neoplasm of breast: Secondary | ICD-10-CM

## 2019-01-11 LAB — COMPREHENSIVE METABOLIC PANEL
ALT: 13 U/L (ref 0–35)
AST: 14 U/L (ref 0–37)
Albumin: 4.7 g/dL (ref 3.5–5.2)
Alkaline Phosphatase: 35 U/L — ABNORMAL LOW (ref 39–117)
BUN: 18 mg/dL (ref 6–23)
CO2: 24 mEq/L (ref 19–32)
Calcium: 9.1 mg/dL (ref 8.4–10.5)
Chloride: 105 mEq/L (ref 96–112)
Creatinine, Ser: 0.74 mg/dL (ref 0.40–1.20)
GFR: 86.68 mL/min (ref >60.00)
Glucose, Bld: 83 mg/dL (ref 70–99)
Potassium: 4.3 mEq/L (ref 3.5–5.1)
Sodium: 137 mEq/L (ref 135–145)
Total Bilirubin: 0.8 mg/dL (ref 0.2–1.2)
Total Protein: 7 g/dL (ref 6.0–8.3)

## 2019-01-11 LAB — CBC WITH DIFFERENTIAL/PLATELET
Basophils Absolute: 0 10*3/uL (ref 0.0–0.1)
Basophils Relative: 0.5 % (ref 0.0–3.0)
Eosinophils Absolute: 0.2 10*3/uL (ref 0.0–0.7)
Eosinophils Relative: 4.1 % (ref 0.0–5.0)
HCT: 41.4 % (ref 36.0–46.0)
Hemoglobin: 14.1 g/dL (ref 12.0–15.0)
Lymphocytes Relative: 43.9 % (ref 12.0–46.0)
Lymphs Abs: 1.8 10*3/uL (ref 0.7–4.0)
MCHC: 34.1 g/dL (ref 30.0–36.0)
MCV: 98.1 fl (ref 78.0–100.0)
Monocytes Absolute: 0.4 10*3/uL (ref 0.1–1.0)
Monocytes Relative: 10.3 % (ref 3.0–12.0)
Neutro Abs: 1.7 10*3/uL (ref 1.4–7.7)
Neutrophils Relative %: 41.2 % — ABNORMAL LOW (ref 43.0–77.0)
Platelets: 132 10*3/uL — ABNORMAL LOW (ref 150.0–400.0)
RBC: 4.23 Mil/uL (ref 3.87–5.11)
RDW: 12.8 % (ref 11.5–15.5)
WBC: 4.2 10*3/uL (ref 4.0–10.5)

## 2019-01-11 LAB — LIPID PANEL
Cholesterol: 173 mg/dL (ref 0–200)
HDL: 65.3 mg/dL (ref >39.00)
LDL Cholesterol: 96 mg/dL (ref 0–99)
NonHDL: 108.19
Total CHOL/HDL Ratio: 3
Triglycerides: 59 mg/dL (ref 0.0–149.0)
VLDL: 11.8 mg/dL (ref 0.0–40.0)

## 2019-01-14 ENCOUNTER — Encounter: Payer: Self-pay | Admitting: Family Medicine

## 2019-01-14 DIAGNOSIS — Z803 Family history of malignant neoplasm of breast: Secondary | ICD-10-CM | POA: Insufficient documentation

## 2019-01-14 DIAGNOSIS — K7689 Other specified diseases of liver: Secondary | ICD-10-CM | POA: Insufficient documentation

## 2019-01-14 NOTE — Progress Notes (Signed)
Subjective:    Haley Bernard is a 43 y.o. female and is here for a comprehensive physical exam. Healthy woman, interested in breast lift and small implants after 4 children. Family history of breast cancer, so screening prior to deciding on surgery. Suspicious area right breast biopsied, benign. Follow up breast MRI in 6 months. Incidental finding of < cm cyst on liver. No systemic symptoms. Nonsmoker. ETOH socially and limited. Hx of thrombocytopenia. Still interested in breast augmentation.   Health Maintenance Due  Topic Date Due  . TETANUS/TDAP  06/25/1997     Current Outpatient Medications:  .  ibuprofen (ADVIL,MOTRIN) 600 MG tablet, Take 1 tablet (600 mg total) by mouth every 6 (six) hours., Disp: 30 tablet, Rfl: 1 .  Nutritional Supplements (JUICE PLUS FIBRE PO), Take by mouth., Disp: , Rfl:   PMHx, SurgHx, SocialHx, Medications, and Allergies were reviewed in the Visit Navigator and updated as appropriate.   Past Medical History:  Diagnosis Date  . H/O varicella   . Migraines   . Thrombocytopenia complicating pregnancy (HCC) 06/2009 and 05/2012   with her 2nd and 3rd pregnancy     Past Surgical History:  Procedure Laterality Date  . BREAST BIOPSY  2020  . VAGINAL CHILDBIRTH  2008, 2010, 2013, 2016  . WISDOM TOOTH EXTRACTION  1998     Family History  Problem Relation Age of Onset  . Melanoma Mother   . Heart attack Father   . Heart disease Father   . Hypertension Father   . Heart disease Paternal Grandfather   . Lung cancer Paternal Grandmother     Social History   Tobacco Use  . Smoking status: Never Smoker  . Smokeless tobacco: Never Used  Substance Use Topics  . Alcohol use: Yes  . Drug use: No    Review of Systems:   Pertinent items are noted in the HPI. Otherwise, ROS is negative.  Objective:   BP 96/64 (BP Location: Left Arm, Patient Position: Sitting, Cuff Size: Normal)   Pulse 61   Ht 5\' 3"  (1.6 m)   Wt 111 lb (50.3 kg)   LMP  12/07/2018 (Approximate)   SpO2 100%   BMI 19.66 kg/m   General appearance: alert, cooperative and appears stated age. Head: normocephalic, without obvious abnormality, atraumatic. Neck: no adenopathy, supple, symmetrical, trachea midline; thyroid not enlarged, symmetric, no tenderness/mass/nodules. Lungs: clear to auscultation bilaterally. Heart: regular rate and rhythm Abdomen: soft, non-tender; no masses,  no organomegaly. Extremities: extremities normal, atraumatic, no cyanosis or edema. Skin: skin color, texture, turgor normal, no rashes or lesions. Lymph: cervical, supraclavicular, and axillary nodes normal; no abnormal inguinal nodes palpated. Neurologic: grossly normal.                             Assessment/Plan:   Haley Bernard was seen today for initial assessment and mri review.  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  [x]    Stressed the importance of regular exercise.   [x]    Substance Abuse: Discussed cessation/primary prevention of tobacco, alcohol, or other drug use; driving or other dangerous activities under the influence; availability of treatment for abuse.   [x]    Injury prevention: Discussed safety belts, safety helmets, smoke detector, smoking near bedding or upholstery.   [x]    Sexuality: Discussed sexually transmitted diseases, partner selection, use of condoms, avoidance of unintended  pregnancy  and contraceptive alternatives.  [x]    Dental health: Discussed importance of regular tooth brushing, flossing, and dental visits.  [x]    Health maintenance and immunizations reviewed. Please refer to Health maintenance section.   Helane Rima, DO [facility]

## 2019-01-19 ENCOUNTER — Other Ambulatory Visit: Payer: Self-pay

## 2019-01-19 DIAGNOSIS — D696 Thrombocytopenia, unspecified: Secondary | ICD-10-CM

## 2019-02-06 ENCOUNTER — Encounter: Payer: Self-pay | Admitting: Hematology

## 2019-02-08 ENCOUNTER — Telehealth: Payer: Self-pay | Admitting: Hematology

## 2019-02-08 NOTE — Telephone Encounter (Signed)
Pt has been rescheduled to see Dr. Mosetta Putt on 4/6 at 145pm. Pt aware to arrive 30 minutes early.

## 2019-02-21 ENCOUNTER — Telehealth: Payer: Self-pay | Admitting: Hematology

## 2019-02-21 NOTE — Telephone Encounter (Signed)
Pt cld to reschedule appt to 5/28 at 830am.

## 2019-02-27 ENCOUNTER — Encounter: Payer: Self-pay | Admitting: Hematology

## 2019-04-19 NOTE — Progress Notes (Signed)
[place]   Telephone:([phone]) [phone] Fax:([phone]) [place] Note   Patient Care Team: Briscoe Deutscher, DO as PCP - General (Family Medicine) Carlos Levering, PA-C as Referring Physician (Family Medicine) Bernadene Bell, MD (Inactive) as Consulting Physician (Hematology) Martinique, Amy, MD as Consulting Physician (Dermatology) Marylynn Pearson, MD as Consulting Physician (Obstetrics and Gynecology)  Date of Service:  04/20/2019   REFERRAL PHYSICIAN: Briscoe Deutscher, DO  CHIEF COMPLAINTS/PURPOSE OF CONSULTATION:  Thrombocytopenia  12/24/14 Initial Consult with me  "On 06/12/14 she brought this to attention of Carlos Levering PA-C at [facility]. A CBC was done which showed WBC 5800, Hemoglobin 13.7, hct 41, platelet count 130,000, MCV 100.9. B 12 level on 06/20/2014 was 367 pg. Folate was 14.2 ng both normal. Patient have started using a B 12 capsule daily. A 2 view chest xray 06/13/2014 showed no active cardiopulmonary process. Repeat CBC on 07/17/2014 showed a WBC 3600, hemoglobin 13.5 gm, hct 40, platelet count 142,000, MCV 100.6.  Patient did not have any constitutional or B symptoms specifically no weight loss, fatigue, anorexia, night sweats, fever, malaise. Even though her WBC count on second check was low her ANC was 5410 and 4210 normal range both times. She was seen by Dr Luella Cook III in surgery who also examined her and recommended surveillance and did not offer surgery because of overall benign presentation. "   HISTORY OF PRESENTING ILLNESS:  Trinitee Horgan Reeg 43 y.o. female is a here because of thrombocytopenia. The patient was referred by her PCP. The patient presents to the clinic today alone. I previously seen her in 2016 for mild thrombocytopenia.   She has had intermittent mild thrombocytopenia since 20 notes she is not currently pregnant with her recent onset of thrombocytopenia. She denies any bleeding at all. She denies any resent  surgery or injury. She notes each of her 4 pregnancies were vaginal with no hemorrhaging.  Today she denies rash, joint pain. She notes she is interested in surgery to get breast implants and wonders if this will effect her. She has been seeing a Psychiatric nurse.   She notes having a breast biopsy in 12/2018 which showed 2 benign lesions.  Her maternal grandmother died from breast cancer. Her mother metastatic melanoma.    REVIEW OF SYSTEMS:   Constitutional: Denies fevers, chills or abnormal night sweats Eyes: Denies blurriness of vision, double vision or watery eyes Ears, nose, mouth, throat, and face: Denies mucositis or sore throat Respiratory: Denies cough, dyspnea or wheezes Cardiovascular: Denies palpitation, chest discomfort or lower extremity swelling Gastrointestinal:  Denies nausea, heartburn or change in bowel habits Skin: Denies abnormal skin rashes Lymphatics: Denies new lymphadenopathy or easy bruising Neurological:Denies numbness, tingling or new weaknesses Behavioral/Psych: Mood is stable, no new changes  All other systems were reviewed with the patient and are negative.   MEDICAL HISTORY:  Past Medical History:  Diagnosis Date  . H/O varicella   . Migraines   . Thrombocytopenia complicating pregnancy ([place]) 06/2009 and 05/2012   with her 2nd and 3rd pregnancy    SURGICAL HISTORY: Past Surgical History:  Procedure Laterality Date  . BREAST BIOPSY  2020  . VAGINAL CHILDBIRTH  2008, 2010, 2013, 2016  . WISDOM TOOTH EXTRACTION  1998    SOCIAL HISTORY: Social History   Socioeconomic History  . Marital status: Married    Spouse name: Not on file  . Number of children: 4  . Years of education: college  . Highest  education level: Not on file  Occupational History    Comment: Homemaker  Social Needs  . Financial resource strain: Not on file  . Food insecurity:    Worry: Not on file    Inability: Not on file  . Transportation needs:    Medical: Not on file     Non-medical: Not on file  Tobacco Use  . Smoking status: Never Smoker  . Smokeless tobacco: Never Used  Substance and Sexual Activity  . Alcohol use: Yes  . Drug use: No  . Sexual activity: Yes    Partners: Male    Birth control/protection: Other-see comments    Comment: Vasectomy  Lifestyle  . Physical activity:    Days per week: 3 days    Minutes per session: 30 min  . Stress: Not on file  Relationships  . Social connections:    Talks on phone: Not on file    Gets together: Not on file    Attends religious service: Not on file    Active member of club or organization: Not on file    Attends meetings of clubs or organizations: Not on file    Relationship status: Not on file  . Intimate partner violence:    Fear of current or ex partner: Not on file    Emotionally abused: Not on file    Physically abused: Not on file    Forced sexual activity: Not on file  Other Topics Concern  . Not on file  Social History Narrative   Patient is a housewife. She has 3 boys ages 19, 92, 41 years old, she is a Forensic psychologist, married and very health conscious and exercises and does aerobic activities.    FAMILY HISTORY: Family History  Problem Relation Age of Onset  . Melanoma Mother   . Heart attack Father   . Heart disease Father   . Hypertension Father   . Heart disease Paternal Grandfather   . Lung cancer Paternal Grandmother   . Cancer Maternal Grandmother 50       breast cancer     ALLERGIES:  has No Known Allergies.  MEDICATIONS:  Current Outpatient Medications  Medication Sig Dispense Refill  . ibuprofen (ADVIL,MOTRIN) 600 MG tablet Take 1 tablet (600 mg total) by mouth every 6 (six) hours. 30 tablet 1  . Nutritional Supplements (JUICE PLUS FIBRE PO) Take by mouth.     No current facility-administered medications for this visit.     PHYSICAL EXAMINATION: ECOG PERFORMANCE STATUS: 0 - Asymptomatic  Vitals:   04/20/19 0833  BP: 102/69  Pulse: 68  Resp: 18   Temp: 98.4 F (36.9 C)  SpO2: 100%   Filed Weights   04/20/19 0833  Weight: 112 lb 11.2 oz (51.1 kg)    GENERAL:alert, no distress and comfortable SKIN: skin color, texture, turgor are normal, no rashes or significant lesions EYES: normal, Conjunctiva are pink and non-injected, sclera clear  NECK: supple, thyroid normal size, non-tender, without nodularity LYMPH:  no palpable lymphadenopathy in the cervical, axillary  LUNGS: clear to auscultation and percussion with normal breathing effort HEART: regular rate & rhythm and no murmurs and no lower extremity edema ABDOMEN:abdomen soft, non-tender and normal bowel sounds Musculoskeletal:no cyanosis of digits and no clubbing  NEURO: alert & oriented x 3 with fluent speech, no focal motor/sensory deficits  LABORATORY DATA:  I have reviewed the data as listed CBC Latest Ref Rng & Units 04/20/2019 01/11/2019 06/12/2015  WBC 4.0 - 10.5 K/uL 3.6(L) 4.2  6.3  Hemoglobin 12.0 - 15.0 g/dL 14.5 14.1 10.5(L)  Hematocrit 36.0 - 46.0 % 44.0 41.4 31.7(L)  Platelets 150 - 400 K/uL 135(L) 132.0(L) 111(L)    CMP Latest Ref Rng & Units 01/11/2019  Glucose 70 - 99 mg/dL 83  BUN 6 - 23 mg/dL 18  Creatinine 0.40 - 1.20 mg/dL 0.74  Sodium 135 - 145 mEq/L 137  Potassium 3.5 - 5.1 mEq/L 4.3  Chloride 96 - 112 mEq/L 105  CO2 19 - 32 mEq/L 24  Calcium 8.4 - 10.5 mg/dL 9.1  Total Protein 6.0 - 8.3 g/dL 7.0  Total Bilirubin 0.2 - 1.2 mg/dL 0.8  Alkaline Phos 39 - 117 U/L 35(L)  AST 0 - 37 U/L 14  ALT 0 - 35 U/L 13     RADIOGRAPHIC STUDIES: I have personally reviewed the radiological images as listed and agreed with the findings in the report. No results found.  ASSESSMENT & PLAN:  DESSIRAE SCAROLA is a 43 y.o. caucasian female with   1. 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 I initially saw her in 12/2014 when she presented during her latest pregnancy.  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  -Given she has no other cytopenias, I have low suspicion for bone marrow disease.  -I recommend she check Hepatitis Panel and HIV testing with her Gyn which can also cause low platelet count. She likely had those tested during her previous pregnancy. I do not suspect this is the case for her.  -I discussed if platelet level is mildly low then treatment is not indicated. If emergent surgery is required she may need platelet infusion. Her current mild degree of thrombocytopenia will be safe for any surgery.  -I discussed watching for petechia, bruising, bleeding of gums, nose, etc. I also discussed aspirin, Advil and Aleve can exacerbate her bleeding if taken often. I suggest she take Tylenol if needed.  -Her physical exam was unremarkable today, no splenomegaly.  -Will do labs today for new baseline. I do not recommend any further testing at this point.  -Will f/u with her yearly  Or sooner if she develops any concerning symptoms. She will see her other physicians with CBC in interim.    2. Genetic Testing, Myriad Panel was negative  -Her GM had breast cancer  -she has started screening mammogram    PLAN:  -Lab today for CBC -Lab and f/u in 1 year, she knows to call me if she has concerns     Orders Placed This Encounter  Procedures  . CBC with Differential (Cancer Center Only)    Standing Status:   Standing    Number of Occurrences:   20    Standing Expiration Date:   04/19/2024  . Platelet by [place]    Standing Status:   Future    Number of Occurrences:   1    Standing Expiration Date:   04/19/2020    All questions were answered. The patient knows to call the clinic with any problems, questions or concerns. I spent 25 minutes counseling the patient face to face. The total time spent in the appointment was 30 minutes and more than 50% was on counseling.     Truitt Merle, MD 04/20/2019 2:26 PM  I, Joslyn Devon, am acting as scribe for Truitt Merle, MD.   I have reviewed the  above documentation for accuracy and completeness, and I agree with the above.

## 2019-04-20 ENCOUNTER — Other Ambulatory Visit: Payer: Self-pay

## 2019-04-20 ENCOUNTER — Telehealth: Payer: Self-pay | Admitting: Hematology

## 2019-04-20 ENCOUNTER — Encounter: Payer: Self-pay | Admitting: Hematology

## 2019-04-20 ENCOUNTER — Inpatient Hospital Stay: Payer: Self-pay

## 2019-04-20 ENCOUNTER — Ambulatory Visit: Payer: Self-pay

## 2019-04-20 ENCOUNTER — Inpatient Hospital Stay: Payer: Self-pay | Attending: Hematology | Admitting: Hematology

## 2019-04-20 VITALS — BP 102/69 | HR 68 | Temp 98.4°F | Resp 18 | Ht 63.0 in | Wt 112.7 lb

## 2019-04-20 DIAGNOSIS — Z803 Family history of malignant neoplasm of breast: Secondary | ICD-10-CM | POA: Insufficient documentation

## 2019-04-20 DIAGNOSIS — Z808 Family history of malignant neoplasm of other organs or systems: Secondary | ICD-10-CM | POA: Insufficient documentation

## 2019-04-20 DIAGNOSIS — D696 Thrombocytopenia, unspecified: Secondary | ICD-10-CM | POA: Insufficient documentation

## 2019-04-20 DIAGNOSIS — Z801 Family history of malignant neoplasm of trachea, bronchus and lung: Secondary | ICD-10-CM | POA: Insufficient documentation

## 2019-04-20 DIAGNOSIS — Z791 Long term (current) use of non-steroidal anti-inflammatories (NSAID): Secondary | ICD-10-CM | POA: Insufficient documentation

## 2019-04-20 LAB — CBC WITH DIFFERENTIAL (CANCER CENTER ONLY)
Abs Immature Granulocytes: 0 10*3/uL (ref 0.00–0.07)
Basophils Absolute: 0 10*3/uL (ref 0.0–0.1)
Basophils Relative: 1 %
Eosinophils Absolute: 0.2 10*3/uL (ref 0.0–0.5)
Eosinophils Relative: 5 %
HCT: 44 % (ref 36.0–46.0)
Hemoglobin: 14.5 g/dL (ref 12.0–15.0)
Immature Granulocytes: 0 %
Lymphocytes Relative: 40 %
Lymphs Abs: 1.5 10*3/uL (ref 0.7–4.0)
MCH: 32.5 pg (ref 26.0–34.0)
MCHC: 33 g/dL (ref 30.0–36.0)
MCV: 98.7 fL (ref 80.0–100.0)
Monocytes Absolute: 0.4 10*3/uL (ref 0.1–1.0)
Monocytes Relative: 10 %
Neutro Abs: 1.6 10*3/uL — ABNORMAL LOW (ref 1.7–7.7)
Neutrophils Relative %: 44 %
Platelet Count: 135 10*3/uL — ABNORMAL LOW (ref 150–400)
RBC: 4.46 MIL/uL (ref 3.87–5.11)
RDW: 12.7 % (ref 11.5–15.5)
WBC Count: 3.6 10*3/uL — ABNORMAL LOW (ref 4.0–10.5)
nRBC: 0 % (ref 0.0–0.2)

## 2019-04-20 LAB — PLATELET BY CITRATE

## 2019-04-20 NOTE — Telephone Encounter (Signed)
No los per 5/28. °

## 2019-04-24 ENCOUNTER — Telehealth: Payer: Self-pay

## 2019-04-24 NOTE — Telephone Encounter (Signed)
Spoke with patient regarding lab results, platelets 135K, result WNL, no concerns.  Patient verbalized an understanding.

## 2019-04-24 NOTE — Telephone Encounter (Signed)
-----   Message from Malachy Mood, MD sent at 04/23/2019 11:11 PM EDT ----- Please let pt know her CBC results, plt 135K, no concerns, thanks   Malachy Mood  04/23/2019

## 2019-05-31 ENCOUNTER — Other Ambulatory Visit: Payer: Self-pay | Admitting: Obstetrics and Gynecology

## 2019-05-31 DIAGNOSIS — R9389 Abnormal findings on diagnostic imaging of other specified body structures: Secondary | ICD-10-CM

## 2019-07-27 ENCOUNTER — Other Ambulatory Visit: Payer: Self-pay

## 2019-08-08 ENCOUNTER — Other Ambulatory Visit: Payer: Self-pay

## 2019-08-08 DIAGNOSIS — Z20822 Contact with and (suspected) exposure to covid-19: Secondary | ICD-10-CM

## 2019-08-10 ENCOUNTER — Other Ambulatory Visit: Payer: Self-pay

## 2019-08-10 LAB — NOVEL CORONAVIRUS, NAA: SARS-CoV-2, NAA: NOT DETECTED

## 2019-08-14 ENCOUNTER — Encounter: Payer: Self-pay | Admitting: Family Medicine

## 2019-08-16 ENCOUNTER — Other Ambulatory Visit: Payer: Self-pay

## 2019-08-16 ENCOUNTER — Encounter: Payer: Self-pay | Admitting: Family Medicine

## 2019-08-16 ENCOUNTER — Ambulatory Visit: Payer: Self-pay | Admitting: Family Medicine

## 2019-08-16 VITALS — BP 102/60 | HR 72 | Temp 96.1°F | Ht 63.0 in | Wt 113.4 lb

## 2019-08-16 DIAGNOSIS — Z23 Encounter for immunization: Secondary | ICD-10-CM

## 2019-08-16 DIAGNOSIS — Z0000 Encounter for general adult medical examination without abnormal findings: Secondary | ICD-10-CM

## 2019-08-16 NOTE — Patient Instructions (Addendum)
Your labs looked great in February. No further liver scan needed. We will be able to see the area with the breast MRI when it happens.   Influenza (Flu) Vaccine (Inactivated or Recombinant): What You Need to Know 1. Why get vaccinated? Influenza vaccine can prevent influenza (flu). Flu is a contagious disease that spreads around the [place] every year, usually between October and May. Anyone can get the flu, but it is more dangerous for some people. Infants and young children, people 41 years of age and older, pregnant women, and people with certain health conditions or a weakened immune system are at greatest risk of flu complications. Pneumonia, bronchitis, sinus infections and ear infections are examples of flu-related complications. If you have a medical condition, such as heart disease, cancer or diabetes, flu can make it worse. Flu can cause fever and chills, sore throat, muscle aches, fatigue, cough, headache, and runny or stuffy nose. Some people may have vomiting and diarrhea, though this is more common in children than adults. Each year thousands of people in the [place] die from flu, and many more are hospitalized. Flu vaccine prevents millions of illnesses and flu-related visits to the doctor each year. 2. Influenza vaccine CDC recommends everyone 41 months of age and older get vaccinated every flu season. Children 6 months through 44 years of age may need 2 doses during a single flu season. Everyone else needs only 1 dose each flu season. It takes about 2 weeks for protection to develop after vaccination. There are many flu viruses, and they are always changing. Each year a new flu vaccine is made to protect against three or four viruses that are likely to cause disease in the upcoming flu season. Even when the vaccine doesn't exactly match these viruses, it may still provide some protection. Influenza vaccine does not cause flu. Influenza vaccine may be given at the same time  as other vaccines. 3. Talk with your health care provider Tell your vaccine provider if the person getting the vaccine:  Has had an allergic reaction after a previous dose of influenza vaccine, or has any severe, life-threatening allergies.  Has ever had Guillain-Barr Syndrome (also called GBS). In some cases, your health care provider may decide to postpone influenza vaccination to a future visit. People with minor illnesses, such as a cold, may be vaccinated. People who are moderately or severely ill should usually wait until they recover before getting influenza vaccine. Your health care provider can give you more information. 4. Risks of a vaccine reaction  Soreness, redness, and swelling where shot is given, fever, muscle aches, and headache can happen after influenza vaccine.  There may be a very small increased risk of Guillain-Barr Syndrome (GBS) after inactivated influenza vaccine (the flu shot). Young children who get the flu shot along with pneumococcal vaccine (PCV13), and/or DTaP vaccine at the same time might be slightly more likely to have a seizure caused by fever. Tell your health care provider if a child who is getting flu vaccine has ever had a seizure. People sometimes faint after medical procedures, including vaccination. Tell your provider if you feel dizzy or have vision changes or ringing in the ears. As with any medicine, there is a very remote chance of a vaccine causing a severe allergic reaction, other serious injury, or death. 5. What if there is a serious problem? An allergic reaction could occur after the vaccinated person leaves the clinic. If you see signs of a severe allergic reaction (hives,  swelling of the face and throat, difficulty breathing, a fast heartbeat, dizziness, or weakness), call 9-1-1 and get the person to the nearest hospital. For other signs that concern you, call your health care provider. Adverse reactions should be reported to the Vaccine  Adverse Event Reporting System (VAERS). Your health care provider will usually file this report, or you can do it yourself. Visit the VAERS website at www.vaers.SamedayNews.es or call 8540604314.VAERS is only for reporting reactions, and VAERS staff do not give medical advice. 6. The National Vaccine Injury Compensation Program The [employer] Vaccine Injury Compensation Program (VICP) is a federal program that was created to compensate people who may have been injured by certain vaccines. Visit the VICP website at GoldCloset.com.ee or call (256) 027-4255 to learn about the program and about filing a claim. There is a time limit to file a claim for compensation. 7. How can I learn more?  Ask your healthcare provider.  Call your local or state health department.  Contact the Centers for Disease Control and Prevention (CDC): ? Call 3200993782 (1-800-CDC-INFO) or ? Visit CDC's https://gibson.com/ Vaccine Information Statement (Interim) Inactivated Influenza Vaccine (07/07/2018) This information is not intended to replace advice given to you by your health care provider. Make sure you discuss any questions you have with your health care provider. Document Released: 09/03/2006 Document Revised: 02/28/2019 Document Reviewed: 07/11/2018 Elsevier Patient Education  2020 [employer].

## 2019-08-16 NOTE — Progress Notes (Signed)
Subjective:    Haley Bernard is a 43 y.o. female and is here for a comprehensive physical exam.  She is doing well.  Due for repeat breast MRI but having an issue with scheduling this.  She has been advised to call the imaging center on the first day of her period, but the imaging center has not had an available appointment.  Health Maintenance Due  Topic Date Due  . TETANUS/TDAP  06/25/1997  . INFLUENZA VACCINE  06/24/2019     Current Outpatient Medications:  .  Nutritional Supplements (JUICE PLUS FIBRE PO), Take by mouth., Disp: , Rfl:   PMHx, SurgHx, SocialHx, Medications, and Allergies were reviewed in the Visit Navigator and updated as appropriate.   Past Medical History:  Diagnosis Date  . H/O varicella   . Migraines   . Thrombocytopenia complicating pregnancy ([place]) 06/2009 and 05/2012   with her 2nd and 3rd pregnancy     Past Surgical History:  Procedure Laterality Date  . BREAST BIOPSY  2020  . VAGINAL CHILDBIRTH  2008, 2010, 2013, 2016  . WISDOM TOOTH EXTRACTION  1998     Family History  Problem Relation Age of Onset  . Melanoma Mother   . Heart attack Father   . Heart disease Father   . Hypertension Father   . Heart disease Paternal Grandfather   . Lung cancer Paternal Grandmother   . Cancer Maternal Grandmother 60       breast cancer     Social History   Tobacco Use  . Smoking status: Never Smoker  . Smokeless tobacco: Never Used  Substance Use Topics  . Alcohol use: Yes  . Drug use: No    Review of Systems:   Pertinent items are noted in the HPI. Otherwise, ROS is negative.  Objective:   BP 102/60   Pulse 72   Temp (!) 96.1 F (35.6 C) (Temporal)   Ht 5\' 3"  (1.6 m)   Wt 113 lb 6.4 oz (51.4 kg)   LMP 08/07/2019   SpO2 97%   BMI 20.09 kg/m   General appearance: alert, cooperative and appears stated age. Head: normocephalic, without obvious abnormality, atraumatic. Neck: no adenopathy, supple, symmetrical, trachea midline;  thyroid not enlarged, symmetric, no tenderness/mass/nodules. Lungs: clear to auscultation bilaterally. Heart: regular rate and rhythm Abdomen: soft, non-tender; no masses,  no organomegaly. Extremities: extremities normal, atraumatic, no cyanosis or edema. Skin: skin color, texture, turgor normal, no rashes or lesions. Lymph: cervical, supraclavicular, and axillary nodes normal; no abnormal inguinal nodes palpated. Neurologic: grossly normal.   Assessment/Plan:   Kerry was seen today for annual exam.  Diagnoses and all orders for this visit:  Routine physical examination  Need for immunization against influenza -     Flu Vaccine QUAD 36+ mos IM   Patient Counseling: [x]    Nutrition: Stressed importance of moderation in sodium/caffeine intake, saturated fat and cholesterol, caloric balance, sufficient intake of fresh fruits, vegetables, fiber, calcium, iron, and 1 mg of folate supplement per day (for females capable of pregnancy).  [x]    Stressed the importance of regular exercise.   [x]    Substance Abuse: Discussed cessation/primary prevention of tobacco, alcohol, or other drug use; driving or other dangerous activities under the influence; availability of treatment for abuse.   [x]    Injury prevention: Discussed safety belts, safety helmets, smoke detector, smoking near bedding or upholstery.   [x]    Sexuality: Discussed sexually transmitted diseases, partner selection, use of condoms, avoidance of unintended pregnancy  and contraceptive alternatives.  [x]    Dental health: Discussed importance of regular tooth brushing, flossing, and dental visits.  [x]    Health maintenance and immunizations reviewed. Please refer to Health maintenance section.   Briscoe Deutscher, DO [place]

## 2019-09-11 ENCOUNTER — Other Ambulatory Visit: Payer: Self-pay

## 2019-09-11 ENCOUNTER — Ambulatory Visit
Admission: RE | Admit: 2019-09-11 | Discharge: 2019-09-11 | Disposition: A | Discharge status: Home / Self Care | Payer: No Typology Code available for payment source | Source: Ambulatory Visit | Attending: Obstetrics and Gynecology | Admitting: Obstetrics and Gynecology

## 2019-09-11 DIAGNOSIS — R9389 Abnormal findings on diagnostic imaging of other specified body structures: Secondary | ICD-10-CM

## 2019-09-11 MED ORDER — GADOBUTROL 1 MMOL/ML IV SOLN
5.0000 mL | Freq: Once | INTRAVENOUS | Status: AC | PRN
  Administered 2019-09-11: 5 mL via INTRAVENOUS

## 2019-09-15 ENCOUNTER — Other Ambulatory Visit: Payer: Self-pay | Admitting: Obstetrics and Gynecology

## 2019-09-15 DIAGNOSIS — R9389 Abnormal findings on diagnostic imaging of other specified body structures: Secondary | ICD-10-CM

## 2020-01-11 ENCOUNTER — Other Ambulatory Visit: Payer: Self-pay | Admitting: Obstetrics and Gynecology

## 2020-01-11 DIAGNOSIS — R928 Other abnormal and inconclusive findings on diagnostic imaging of breast: Secondary | ICD-10-CM

## 2020-01-17 ENCOUNTER — Ambulatory Visit
Admission: RE | Admit: 2020-01-17 | Discharge: 2020-01-17 | Disposition: A | Discharge status: Home / Self Care | Payer: No Typology Code available for payment source | Source: Ambulatory Visit | Attending: Obstetrics and Gynecology | Admitting: Obstetrics and Gynecology

## 2020-01-17 ENCOUNTER — Other Ambulatory Visit: Payer: Self-pay

## 2020-01-17 DIAGNOSIS — R928 Other abnormal and inconclusive findings on diagnostic imaging of breast: Secondary | ICD-10-CM

## 2020-02-08 ENCOUNTER — Ambulatory Visit
Admission: RE | Admit: 2020-02-08 | Discharge: 2020-02-08 | Disposition: A | Discharge status: Home / Self Care | Payer: Self-pay | Source: Ambulatory Visit | Attending: Obstetrics and Gynecology | Admitting: Obstetrics and Gynecology

## 2020-02-08 ENCOUNTER — Other Ambulatory Visit: Payer: Self-pay

## 2020-02-08 DIAGNOSIS — R9389 Abnormal findings on diagnostic imaging of other specified body structures: Secondary | ICD-10-CM

## 2020-02-08 MED ORDER — GADOBUTROL 1 MMOL/ML IV SOLN
5.0000 mL | Freq: Once | INTRAVENOUS | Status: AC | PRN
  Administered 2020-02-08: 5 mL via INTRAVENOUS

## 2020-02-22 IMAGING — MR MR BILATERAL BREAST WITHOUT AND WITH CONTRAST
8 of 12 series · 32 of 48 positions shown · IV contrast (6 ml  gadavist)
Comparison: Prior mammograms dated 11/29/2018 and 01/30/2014.

CLINICAL DATA: 42-year-old female with strong family history of
breast cancer, calculated lifetime risk of developing breast cancer
24%.

LABS:  Not applicable
EXAM:
BILATERAL BREAST MRI WITH AND WITHOUT CONTRAST
TECHNIQUE: Multiplanar, multisequence MR images of both breasts were obtained
prior to and following the intravenous administration of 6 ml of
Gadavist

[Series 2: t2_tirm_tra ipat (a-p) · axial · 3.0mm · 0.62mm/px · 1 of 56 slices shown]
[im 1/56]
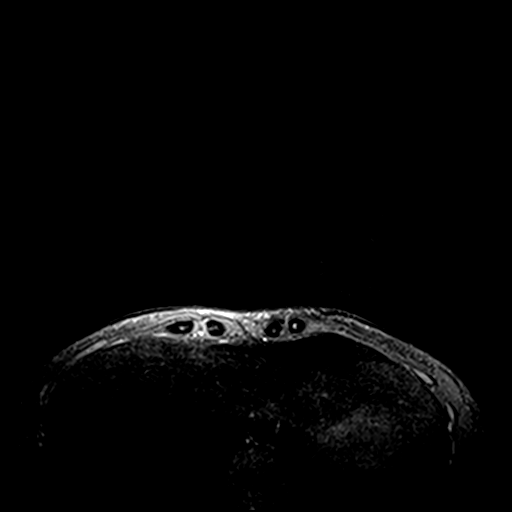

[Series 3: fl3d pre-cm no · axial · non-contrast · 1.2mm · 0.83mm/px · z∈[-84,+88]mm · 5 of 144 slices shown]
[im 1/144]
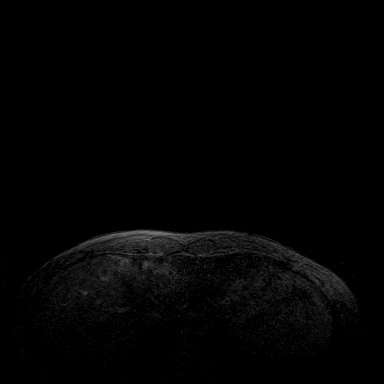
[im 36/144]
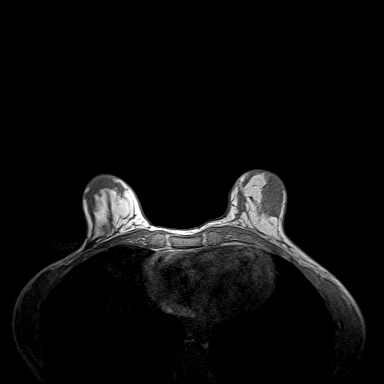
[im 72/144]
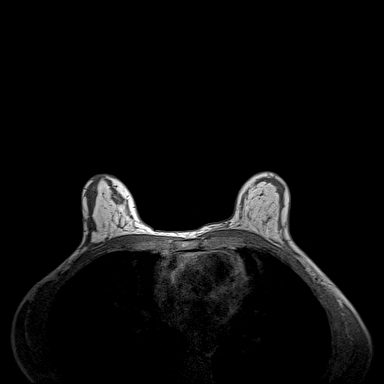
[im 108/144]
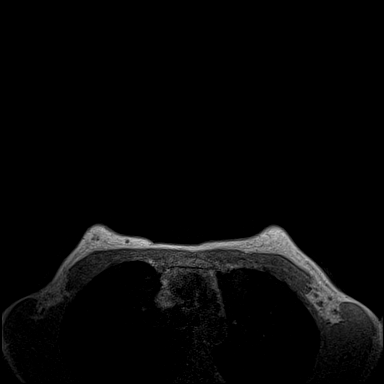
[im 144/144]
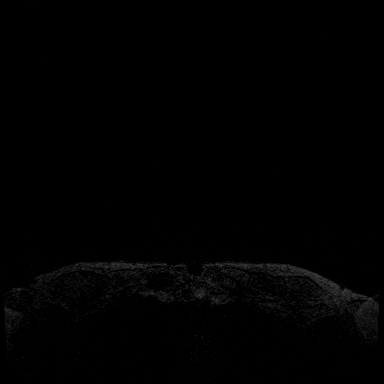

[Series 4: fl3d pre-cm · axial · non-contrast · 1.2mm · 0.83mm/px · z∈[-84,+88]mm · 5 of 144 slices shown]
[im 1/144]
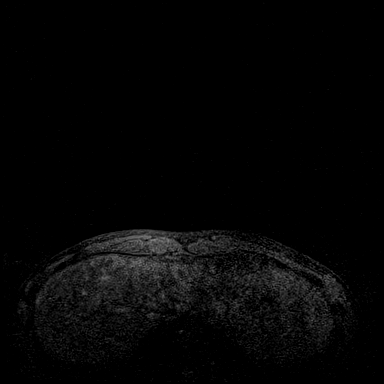
[im 36/144]
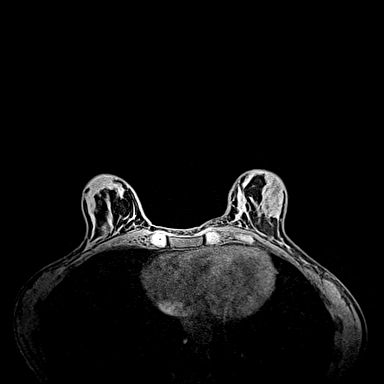
[im 72/144]
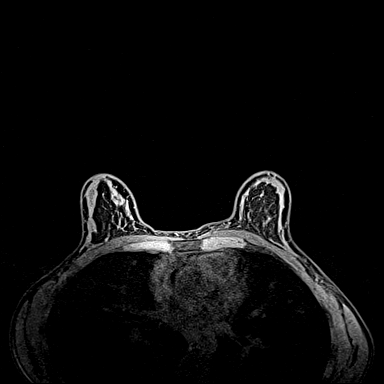
[im 108/144]
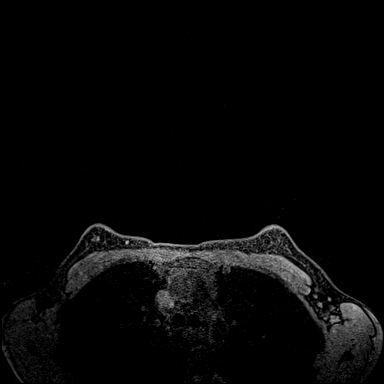
[im 144/144]
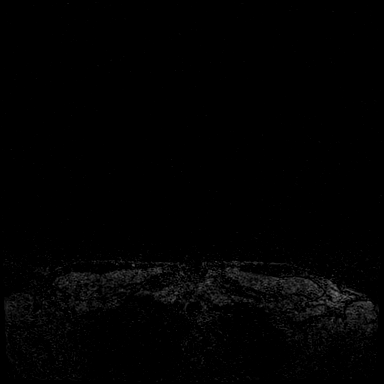

[Series 5: fl3d post immediate · axial · 1.2mm · 0.83mm/px · z∈[-84,+88]mm · 5 of 144 slices shown (1 of 3)]
[im 1/144]
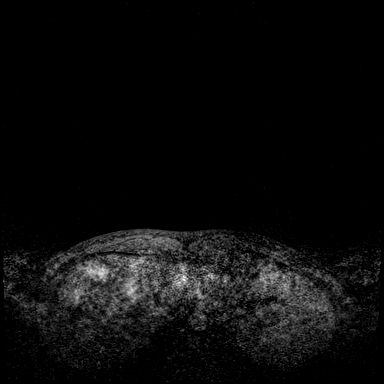
[im 36/144]
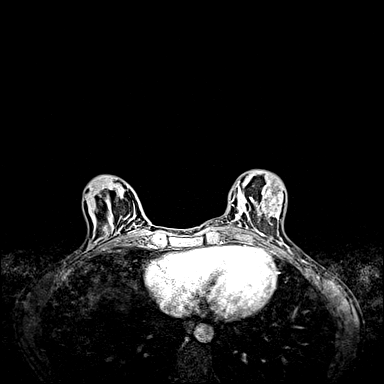
[im 72/144]
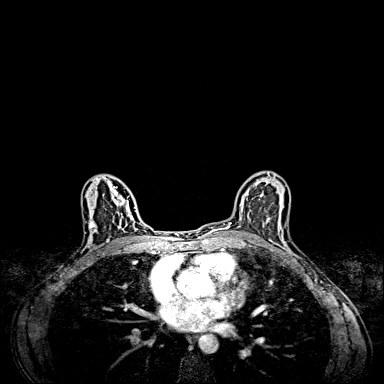
[im 108/144]
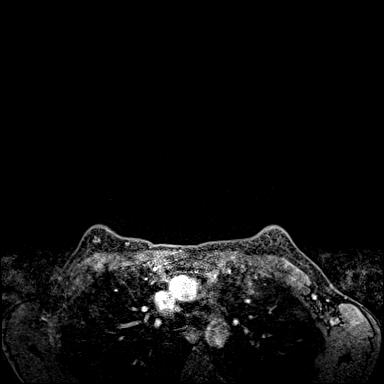
[im 144/144]
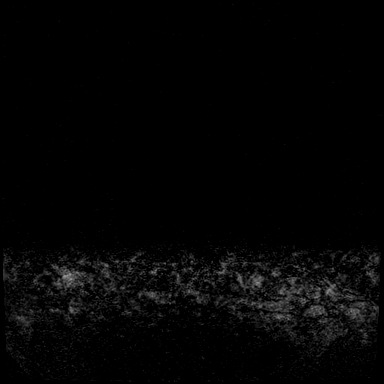

[Series 6: fl3d post immediate · axial · 1.2mm · 0.83mm/px · z∈[-84,+88]mm · 5 of 144 slices shown (2 of 3)]
[im 1/144]
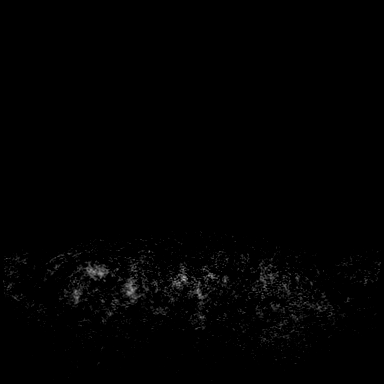
[im 36/144]
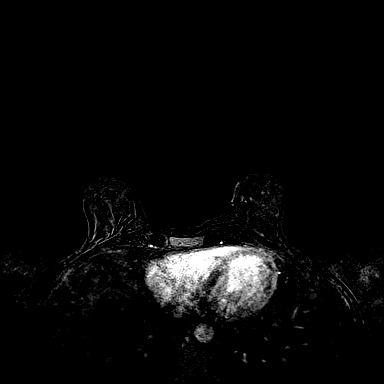
[im 72/144]
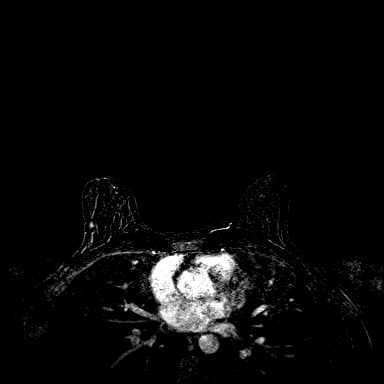
[im 108/144]
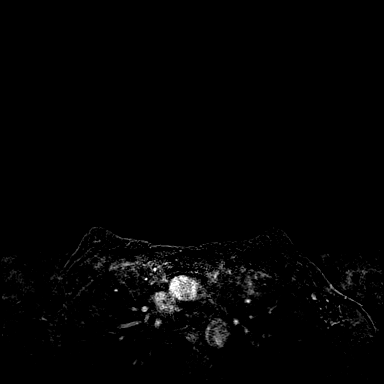
[im 144/144]
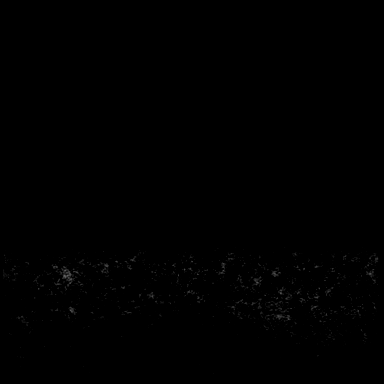

[Series 7: fl3d post immediate · axial · 172.8mm · 0.83mm/px · 1 of 1 slices shown (3 of 3)]
[im 1/1]
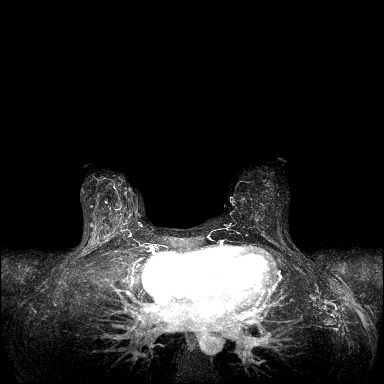

[Series 8: fl3d post 3min · axial · 1.2mm · 0.83mm/px · z∈[-84,+88]mm · 6 of 144 slices shown]
[im 1/144]
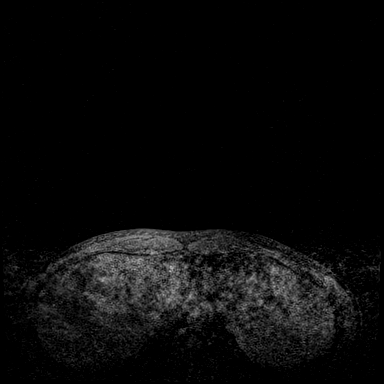
[im 29/144]
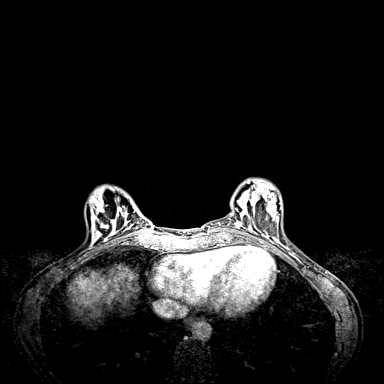
[im 58/144]
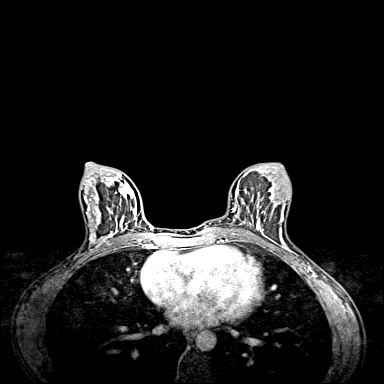
[im 86/144]
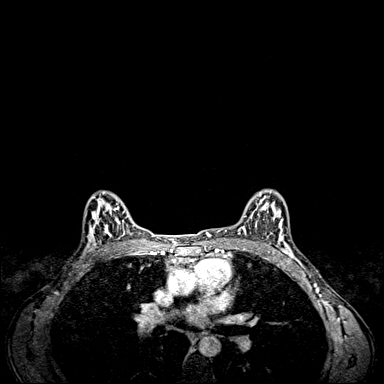
[im 115/144]
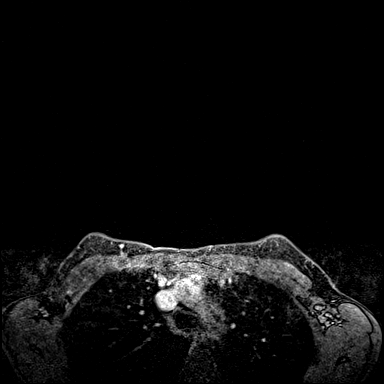
[im 144/144]
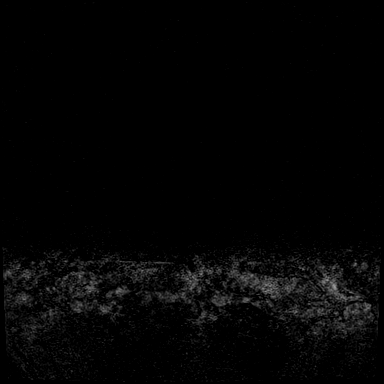

[Series 9: fl3d post 3min_sub · axial · 1.2mm · 0.83mm/px · z∈[-84,+18]mm · 4 of 144 slices shown]
[im 1/144]
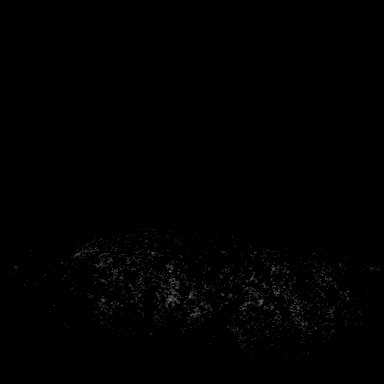
[im 29/144]
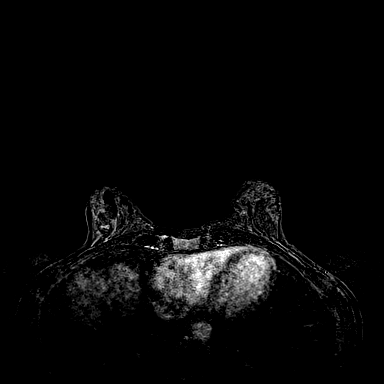
[im 58/144]
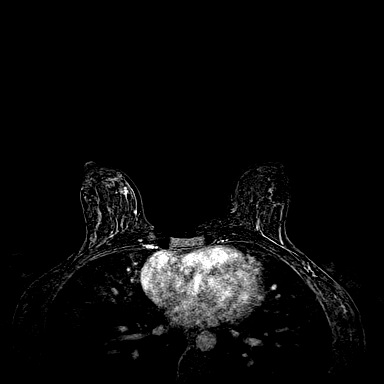
[im 86/144]
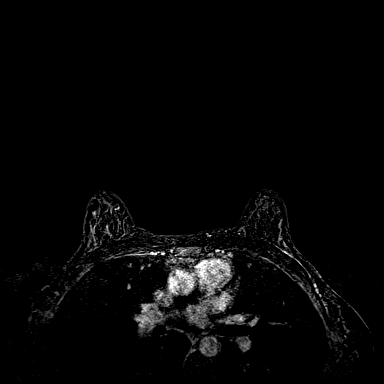

[32 of 48 positions shown; findings below may reference images not displayed]

Three-dimensional MR images were rendered by post-processing of the
original MR data on an independent workstation. The
three-dimensional MR images were interpreted, and findings are
reported in the following complete MRI report for this study. Three
dimensional images were evaluated at the independent [employer]
workstation
FINDINGS: Breast composition: c.  Heterogeneous fibroglandular tissue.

Background parenchymal enhancement: There is moderate background
parenchymal enhancement with several bilateral enhancing foci felt
to be benign given multiplicity and bilaterality.

Right breast: There is an oval enhancing mass in the lower inner
right breast measuring approximately 0.6 cm on subtraction image
102. In addition, there are 2 adjacent enhancing masses in the upper
inner right breast measuring 1.2 cm AP, 0.9 cm transverse and 0.7 cm
craniocaudal (subtraction image 78).

Left breast: No mass or abnormal enhancement.

Lymph nodes: No abnormal appearing lymph nodes.

Ancillary findings: 0.6 cm nonenhancing nodule in the hepatic dome
likely a cyst or hemangioma.
IMPRESSION: 1. Oval enhancing mass in the lower inner right breast (subtraction
image 102) and 2 adjacent enhancing masses in the upper inner right
breast (subtraction image 78).

2.  No MRI evidence of malignancy in the left breast.

3.  Moderate background enhancement.

RECOMMENDATION:
Recommend the patient return for second-look ultrasound to
evaluate/characterize the oval enhancing mass in the lower inner
right breast and the adjacent enhancing masses in the upper inner
right breast for potential biopsy versus short-term follow-up. If
these areas are not identified via ultrasound, then MRI biopsy is
recommended.

BI-RADS CATEGORY  4: Suspicious.

## 2020-04-10 ENCOUNTER — Other Ambulatory Visit: Payer: Self-pay | Admitting: *Deleted

## 2020-04-10 DIAGNOSIS — I83893 Varicose veins of bilateral lower extremities with other complications: Secondary | ICD-10-CM

## 2020-04-17 ENCOUNTER — Ambulatory Visit: Payer: Self-pay | Admitting: Vascular Surgery

## 2020-04-17 ENCOUNTER — Encounter (HOSPITAL_COMMUNITY): Payer: Self-pay

## 2021-05-12 ENCOUNTER — Ambulatory Visit (INDEPENDENT_AMBULATORY_CARE_PROVIDER_SITE_OTHER): Payer: Self-pay | Admitting: Psychology

## 2021-05-12 DIAGNOSIS — F4322 Adjustment disorder with anxiety: Secondary | ICD-10-CM

## 2021-05-29 ENCOUNTER — Ambulatory Visit: Payer: Self-pay | Admitting: Psychology

## 2021-06-12 ENCOUNTER — Ambulatory Visit (INDEPENDENT_AMBULATORY_CARE_PROVIDER_SITE_OTHER): Payer: Self-pay | Admitting: Psychology

## 2021-06-12 DIAGNOSIS — F4322 Adjustment disorder with anxiety: Secondary | ICD-10-CM

## 2021-06-26 ENCOUNTER — Ambulatory Visit: Payer: Self-pay | Admitting: Psychology

## 2021-07-17 ENCOUNTER — Ambulatory Visit (INDEPENDENT_AMBULATORY_CARE_PROVIDER_SITE_OTHER): Payer: Self-pay | Admitting: Psychology

## 2021-07-17 ENCOUNTER — Other Ambulatory Visit: Payer: Self-pay

## 2021-07-17 DIAGNOSIS — F4322 Adjustment disorder with anxiety: Secondary | ICD-10-CM

## 2021-07-31 ENCOUNTER — Ambulatory Visit (INDEPENDENT_AMBULATORY_CARE_PROVIDER_SITE_OTHER): Payer: Self-pay | Admitting: Psychology

## 2021-07-31 DIAGNOSIS — F4322 Adjustment disorder with anxiety: Secondary | ICD-10-CM

## 2021-08-21 ENCOUNTER — Ambulatory Visit: Payer: Self-pay | Admitting: Psychology

## 2021-09-11 ENCOUNTER — Ambulatory Visit (INDEPENDENT_AMBULATORY_CARE_PROVIDER_SITE_OTHER): Payer: Self-pay | Admitting: Psychology

## 2021-09-11 ENCOUNTER — Other Ambulatory Visit: Payer: Self-pay

## 2021-09-11 DIAGNOSIS — F4322 Adjustment disorder with anxiety: Secondary | ICD-10-CM

## 2021-09-17 ENCOUNTER — Other Ambulatory Visit: Payer: Self-pay | Admitting: Obstetrics and Gynecology

## 2021-09-17 DIAGNOSIS — Z803 Family history of malignant neoplasm of breast: Secondary | ICD-10-CM

## 2021-10-02 ENCOUNTER — Ambulatory Visit (INDEPENDENT_AMBULATORY_CARE_PROVIDER_SITE_OTHER): Payer: Self-pay | Admitting: Psychology

## 2021-10-02 DIAGNOSIS — F4322 Adjustment disorder with anxiety: Secondary | ICD-10-CM

## 2021-10-30 ENCOUNTER — Ambulatory Visit (INDEPENDENT_AMBULATORY_CARE_PROVIDER_SITE_OTHER): Payer: Self-pay | Admitting: Psychology

## 2021-10-30 DIAGNOSIS — F4322 Adjustment disorder with anxiety: Secondary | ICD-10-CM

## 2021-10-30 NOTE — Progress Notes (Signed)
[facility] Counselor/Therapist Progress Note  Patient ID: Haley Bernard, MRN: [id number],    Date: 10/30/2021  Time Spent: 1:30pm-2:20pm   Treatment Type: Individual Therapy  Pt is seen for a virtual video visit.  Pt joins from her home and counselor from her home office.    Reported Symptoms: anxiety, easily annoyed  Mental Status Exam: Appearance:  Well Groomed     Behavior: Appropriate  Motor: Normal  Speech/Language:  Normal Rate  Affect: Appropriate  Mood: anxious and irritable  Thought process: normal  Thought content:   WNL  Sensory/Perceptual disturbances:   WNL  Orientation: oriented to person, place, time/date, and situation  Attention: Good  Concentration: Good  Memory: WNL  Fund of knowledge:  Good  Insight:   Good  Judgment:  Good  Impulse Control: Good   Risk Assessment: Danger to Self:  No Self-injurious Behavior: No Danger to Others: No Duty to Warn:no Physical Aggression / Violence:No  Access to Firearms a concern: No  Gang Involvement:No   Subjective: Counselor assessed pt current functioning per pt report.  Processed w/pt coping w/ stressors or holidays and interactions w/ husband and children. Discussed ways of pausing before responding and mindfulness practice.  Explored conflict resolution for disagreement.  Pt affect wnl.  Pt reported that she has been focused on being more present.  Pt reported that has been some conflict/disagreement w/ her and husband in preparing rental property.  Pt reported that she dislikes how always quick to say no and get into conflict. Pt is able to identify recognizing that each different perspective and presenting options or alternative w/out no.  Pt identified things she is looking forward to w/ family and holidays.    Interventions: Cognitive Behavioral Therapy, Insight-Oriented, Interpersonal, and mindfulness  Diagnosis:Adjustment disorder with anxiety  Plan: Pt to f/u in 1 month for counseling to  assist coping w/ adjusting to life stressors and reducing anxiety.  See tx plan on file in therapy charts.  Pt to f/u as scheduled w/ PCP.   Forde Radon, [facility]

## 2021-11-07 ENCOUNTER — Other Ambulatory Visit: Payer: Self-pay

## 2021-11-07 ENCOUNTER — Ambulatory Visit
Admission: RE | Admit: 2021-11-07 | Discharge: 2021-11-07 | Disposition: A | Discharge status: Home / Self Care | Payer: Self-pay | Source: Ambulatory Visit | Attending: Obstetrics and Gynecology | Admitting: Obstetrics and Gynecology

## 2021-11-07 DIAGNOSIS — Z803 Family history of malignant neoplasm of breast: Secondary | ICD-10-CM

## 2021-11-07 MED ORDER — GADOBUTROL 1 MMOL/ML IV SOLN
5.0000 mL | Freq: Once | INTRAVENOUS | Status: AC | PRN
  Administered 2021-11-07: 5 mL via INTRAVENOUS

## 2021-12-04 ENCOUNTER — Other Ambulatory Visit: Payer: Self-pay

## 2021-12-04 ENCOUNTER — Ambulatory Visit (INDEPENDENT_AMBULATORY_CARE_PROVIDER_SITE_OTHER): Payer: Self-pay | Admitting: Psychology

## 2021-12-04 DIAGNOSIS — F4322 Adjustment disorder with anxiety: Secondary | ICD-10-CM
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
## 2021-12-24 ENCOUNTER — Encounter: Payer: Self-pay | Admitting: Internal Medicine

## 2021-12-25 ENCOUNTER — Ambulatory Visit (INDEPENDENT_AMBULATORY_CARE_PROVIDER_SITE_OTHER): Payer: Self-pay | Admitting: Psychology

## 2021-12-25 ENCOUNTER — Other Ambulatory Visit: Payer: Self-pay

## 2021-12-25 DIAGNOSIS — F4322 Adjustment disorder with anxiety: Secondary | ICD-10-CM
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
## 2022-01-05 ENCOUNTER — Encounter: Payer: Self-pay | Admitting: Internal Medicine

## 2022-01-22 ENCOUNTER — Ambulatory Visit (INDEPENDENT_AMBULATORY_CARE_PROVIDER_SITE_OTHER): Payer: Self-pay | Admitting: Psychology

## 2022-01-22 ENCOUNTER — Other Ambulatory Visit: Payer: Self-pay

## 2022-01-22 DIAGNOSIS — F4322 Adjustment disorder with anxiety: Secondary | ICD-10-CM
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
## 2022-02-03 ENCOUNTER — Encounter: Payer: Self-pay | Admitting: Gastroenterology

## 2022-03-12 ENCOUNTER — Ambulatory Visit (INDEPENDENT_AMBULATORY_CARE_PROVIDER_SITE_OTHER): Payer: Self-pay | Admitting: Psychology

## 2022-03-12 DIAGNOSIS — F4322 Adjustment disorder with anxiety: Secondary | ICD-10-CM
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
## 2023-01-21 ENCOUNTER — Other Ambulatory Visit: Payer: Self-pay | Admitting: Obstetrics and Gynecology

## 2023-01-21 DIAGNOSIS — Z1211 Encounter for screening for malignant neoplasm of colon: Secondary | ICD-10-CM

## 2023-07-21 ENCOUNTER — Other Ambulatory Visit: Payer: Self-pay

## 2023-08-18 ENCOUNTER — Other Ambulatory Visit: Payer: Self-pay | Admitting: *Deleted

## 2023-08-18 DIAGNOSIS — I8393 Asymptomatic varicose veins of bilateral lower extremities: Secondary | ICD-10-CM

## 2023-08-24 ENCOUNTER — Encounter (HOSPITAL_COMMUNITY): Payer: Self-pay

## 2023-08-24 ENCOUNTER — Encounter: Payer: Self-pay | Admitting: Vascular Surgery

## 2023-08-26 ENCOUNTER — Ambulatory Visit
Admission: RE | Admit: 2023-08-26 | Discharge: 2023-08-26 | Disposition: A | Discharge status: Home / Self Care | Payer: No Typology Code available for payment source | Source: Ambulatory Visit | Attending: Obstetrics and Gynecology | Admitting: Obstetrics and Gynecology

## 2023-08-26 DIAGNOSIS — Z1211 Encounter for screening for malignant neoplasm of colon: Secondary | ICD-10-CM

## 2023-08-26 MED ORDER — GADOPICLENOL 0.5 MMOL/ML IV SOLN
5.0000 mL | Freq: Once | INTRAVENOUS | Status: AC | PRN
  Administered 2023-08-26: 5 mL via INTRAVENOUS

## 2024-01-27 ENCOUNTER — Other Ambulatory Visit: Payer: Self-pay | Admitting: Obstetrics and Gynecology

## 2024-01-27 DIAGNOSIS — Z1239 Encounter for other screening for malignant neoplasm of breast: Secondary | ICD-10-CM

## 2024-06-09 ENCOUNTER — Encounter: Payer: Self-pay | Admitting: Advanced Practice Midwife

## 2024-08-10 ENCOUNTER — Ambulatory Visit
Admission: RE | Admit: 2024-08-10 | Discharge: 2024-08-10 | Disposition: A | Discharge status: Home / Self Care | Payer: Self-pay | Source: Ambulatory Visit | Attending: Obstetrics and Gynecology | Admitting: Obstetrics and Gynecology

## 2024-08-10 DIAGNOSIS — Z1239 Encounter for other screening for malignant neoplasm of breast: Secondary | ICD-10-CM

## 2024-08-10 MED ORDER — GADOPICLENOL 0.5 MMOL/ML IV SOLN
5.0000 mL | Freq: Once | INTRAVENOUS | Status: AC | PRN
  Administered 2024-08-10: 5 mL via INTRAVENOUS

## 2024-12-23 ENCOUNTER — Other Ambulatory Visit (HOSPITAL_COMMUNITY): Payer: Self-pay
